# Patient Record
Sex: Female | Born: 1945 | Race: White | Hispanic: No | Smoking: Former smoker
Health system: Southern US, Community
[De-identification: ages and names within clinical notes are randomized; demographics above are authoritative.]

## PROBLEM LIST (undated history)

## (undated) DIAGNOSIS — I10 Essential (primary) hypertension: Secondary | ICD-10-CM

## (undated) DIAGNOSIS — E7800 Pure hypercholesterolemia, unspecified: Secondary | ICD-10-CM

## (undated) DIAGNOSIS — I700 Atherosclerosis of aorta: Secondary | ICD-10-CM

## (undated) DIAGNOSIS — R7303 Prediabetes: Secondary | ICD-10-CM

## (undated) DIAGNOSIS — M1990 Unspecified osteoarthritis, unspecified site: Secondary | ICD-10-CM

## (undated) DIAGNOSIS — E039 Hypothyroidism, unspecified: Secondary | ICD-10-CM

## (undated) HISTORY — PX: COLONOSCOPY: SHX174

## (undated) HISTORY — PX: APPENDECTOMY: SHX54

## (undated) HISTORY — PX: TONSILLECTOMY: SUR1361

---

## 2017-07-08 DEATH — deceased

## 2018-11-28 ENCOUNTER — Other Ambulatory Visit: Payer: Self-pay | Admitting: Orthopedic Surgery

## 2018-11-28 DIAGNOSIS — M5442 Lumbago with sciatica, left side: Principal | ICD-10-CM

## 2018-11-28 DIAGNOSIS — M5441 Lumbago with sciatica, right side: Principal | ICD-10-CM

## 2018-11-28 DIAGNOSIS — G8929 Other chronic pain: Secondary | ICD-10-CM

## 2018-11-28 DIAGNOSIS — M9933 Osseous stenosis of neural canal of lumbar region: Secondary | ICD-10-CM

## 2018-12-06 ENCOUNTER — Ambulatory Visit
Admission: RE | Admit: 2018-12-06 | Discharge: 2018-12-06 | Disposition: A | Discharge status: Home / Self Care | Payer: Medicare Other | Source: Ambulatory Visit | Attending: Orthopedic Surgery | Admitting: Orthopedic Surgery

## 2018-12-06 DIAGNOSIS — M5442 Lumbago with sciatica, left side: Secondary | ICD-10-CM | POA: Diagnosis present

## 2018-12-06 DIAGNOSIS — G8929 Other chronic pain: Secondary | ICD-10-CM | POA: Diagnosis present

## 2018-12-06 DIAGNOSIS — M5441 Lumbago with sciatica, right side: Secondary | ICD-10-CM | POA: Diagnosis present

## 2018-12-06 DIAGNOSIS — M9933 Osseous stenosis of neural canal of lumbar region: Secondary | ICD-10-CM | POA: Diagnosis present

## 2018-12-23 ENCOUNTER — Ambulatory Visit
Admission: RE | Admit: 2018-12-23 | Discharge: 2018-12-23 | Disposition: A | Discharge status: Home / Self Care | Payer: Medicare Other | Source: Ambulatory Visit | Attending: Neurosurgery | Admitting: Neurosurgery

## 2018-12-23 ENCOUNTER — Other Ambulatory Visit: Payer: Self-pay

## 2018-12-23 ENCOUNTER — Encounter
Admission: RE | Admit: 2018-12-23 | Discharge: 2018-12-23 | Disposition: A | Discharge status: Home / Self Care | Payer: Medicare Other | Source: Ambulatory Visit | Attending: Neurosurgery | Admitting: Neurosurgery

## 2018-12-23 DIAGNOSIS — I10 Essential (primary) hypertension: Secondary | ICD-10-CM

## 2018-12-23 DIAGNOSIS — R9431 Abnormal electrocardiogram [ECG] [EKG]: Secondary | ICD-10-CM | POA: Diagnosis not present

## 2018-12-23 HISTORY — DX: Hypothyroidism, unspecified: E03.9

## 2018-12-23 HISTORY — DX: Unspecified osteoarthritis, unspecified site: M19.90

## 2018-12-23 HISTORY — DX: Essential (primary) hypertension: I10

## 2018-12-23 LAB — URINALYSIS, ROUTINE W REFLEX MICROSCOPIC
Bacteria, UA: NONE SEEN
Bilirubin Urine: NEGATIVE
Glucose, UA: NEGATIVE mg/dL
Hgb urine dipstick: NEGATIVE
Ketones, ur: NEGATIVE mg/dL
Nitrite: NEGATIVE
Protein, ur: NEGATIVE mg/dL
Specific Gravity, Urine: 1.011 (ref 1.005–1.030)
pH: 6 (ref 5.0–8.0)

## 2018-12-23 LAB — BASIC METABOLIC PANEL
Anion gap: 11 (ref 5–15)
BUN: 22 mg/dL (ref 8–23)
CO2: 22 mmol/L (ref 22–32)
Calcium: 9.5 mg/dL (ref 8.9–10.3)
Chloride: 105 mmol/L (ref 98–111)
Creatinine, Ser: 0.53 mg/dL (ref 0.44–1.00)
GFR calc Af Amer: >60 mL/min (ref >60)
GLUCOSE: 93 mg/dL (ref 70–99)
Potassium: 3.9 mmol/L (ref 3.5–5.1)
Sodium: 138 mmol/L (ref 135–145)

## 2018-12-23 LAB — CBC
HCT: 36.4 % (ref 36.0–46.0)
Hemoglobin: 11.4 g/dL — ABNORMAL LOW (ref 12.0–15.0)
MCH: 27.6 pg (ref 26.0–34.0)
MCHC: 31.3 g/dL (ref 30.0–36.0)
MCV: 88.1 fL (ref 80.0–100.0)
PLATELETS: 279 10*3/uL (ref 150–400)
RBC: 4.13 MIL/uL (ref 3.87–5.11)
RDW: 14.3 % (ref 11.5–15.5)
WBC: 9.1 10*3/uL (ref 4.0–10.5)
nRBC: 0 % (ref 0.0–0.2)

## 2018-12-23 LAB — PROTIME-INR
INR: 1 (ref 0.8–1.2)
Prothrombin Time: 12.7 seconds (ref 11.4–15.2)

## 2018-12-23 LAB — APTT: aPTT: 33 seconds (ref 24–36)

## 2018-12-23 NOTE — Patient Instructions (Signed)
Your procedure is scheduled on: 12/29/18 Report to Radiology. [facility] To find out your arrival time please call [phone] between 1PM - 3PM on 12/26/18.  Remember: Instructions that are not followed completely may result in serious medical risk,  up to and including death, or upon the discretion of your surgeon and anesthesiologist your  surgery may need to be rescheduled.     _X__ 1. Do not eat food after midnight the night before your procedure.                 No gum chewing or hard candies. You may drink clear liquids up to 2 hours                 before you are scheduled to arrive for your surgery- DO not drink clear                 liquids within 2 hours of the start of your surgery.                 Clear Liquids include:  water, apple juice without pulp, clear carbohydrate                 drink such as Clearfast of Gatorade, Black Coffee or Tea (Do not add                 anything to coffee or tea).  __X__2.  On the morning of surgery brush your teeth with toothpaste and water, you                may rinse your mouth with mouthwash if you wish.  Do not swallow any toothpaste of mouthwash.     _X__ 3.  No Alcohol for 24 hours before or after surgery.   _X__ 4.  Do Not Smoke or use e-cigarettes For 24 Hours Prior to Your Surgery.                 Do not use any chewable tobacco products for at least 6 hours prior to                 surgery.  ____  5.  Bring all medications with you on the day of surgery if instructed.   _X  6.  Notify your doctor if there is any change in your medical condition      (cold, fever, infections).     Do not wear jewelry, make-up, hairpins, clips or nail polish. Do not wear lotions, powders, or perfumes. You may wear deodorant. Do not shave 48 hours prior to surgery. Men may shave face and neck. Do not bring valuables to the hospital.    [facility] is not responsible for any belongings or  valuables.  Contacts, dentures or bridgework may not be worn into surgery. Leave your suitcase in the car. After surgery it may be brought to your room. For patients admitted to the hospital, discharge time is determined by your treatment team.   Patients discharged the day of surgery will not be allowed to drive home.   Please read over the following fact sheets that you were given:   Surgical Site Infection Prevention          ____ Take these medicines the morning of surgery with A SIP OF WATER:    1. GABAPENTIN  2. LEVOTHYROXINE  3.   4.  5.  6.  ____ Fleet Enema (as directed)   _X___  Use CHG Soap as directed  ____ Use inhalers on the day of surgery  ____ Stop metformin 2 days prior to surgery    ____ Take 1/2 of usual insulin dose the night before surgery. No insulin the morning          of surgery.   ____ Stop Coumadin/Plavix/aspirin on  __X__ Stop Anti-inflammatories on  12/23/18. OK TO TAKE TYLENOL   _X___ Stop supplements until after surgery.  NO FISH OIL UNTIL AFTER SURGERY  ____ Bring C-Pap to the hospital.

## 2018-12-24 NOTE — Pre-Procedure Instructions (Signed)
ua faxed to dr Adriana Simas

## 2019-03-05 ENCOUNTER — Encounter
Admission: RE | Admit: 2019-03-05 | Discharge: 2019-03-05 | Disposition: A | Discharge status: Home / Self Care | Payer: Medicare Other | Source: Ambulatory Visit | Attending: Neurosurgery | Admitting: Neurosurgery

## 2019-03-05 ENCOUNTER — Other Ambulatory Visit: Payer: Self-pay

## 2019-03-05 DIAGNOSIS — Z01812 Encounter for preprocedural laboratory examination: Secondary | ICD-10-CM | POA: Insufficient documentation

## 2019-03-05 DIAGNOSIS — Z1159 Encounter for screening for other viral diseases: Secondary | ICD-10-CM | POA: Diagnosis not present

## 2019-03-05 LAB — TYPE AND SCREEN
ABO/RH(D): O POS
Antibody Screen: NEGATIVE

## 2019-03-05 NOTE — Patient Instructions (Signed)
Your procedure is scheduled on:Mon 03/09/19  Report to Day Surgery.  [facility] To find out your arrival time please call [phone] between 1PM - 3PM on Friday 5/29  Remember: Instructions that are not followed completely may result in serious medical risk,  up to and including death, or upon the discretion of your surgeon and anesthesiologist your  surgery may need to be rescheduled.     _X__ 1. Do not eat food after midnight the night before your procedure.                 No gum chewing or hard candies. You may drink clear liquids up to 2 hours                 before you are scheduled to arrive for your surgery- DO not drink clear                 liquids within 2 hours of the start of your surgery.                 Clear Liquids include:  water, apple juice without pulp, clear carbohydrate                 drink such as Clearfast of Gatorade, Black Coffee or Tea (Do not add                 anything to coffee or tea).  __X__2.  On the morning of surgery brush your teeth with toothpaste and water, you                may rinse your mouth with mouthwash if you wish.  Do not swallow any toothpaste of mouthwash.     _X__ 3.  No Alcohol for 24 hours before or after surgery.   ___ 4.  Do Not Smoke or use e-cigarettes For 24 Hours Prior to Your Surgery.                 Do not use any chewable tobacco products for at least 6 hours prior to                 surgery.  ____  5.  Bring all medications with you on the day of surgery if instructed.   ___x_  6.  Notify your doctor if there is any change in your medical condition      (cold, fever, infections).     Do not wear jewelry, make-up, hairpins, clips or nail polish. Do not wear lotions, powders, or perfumes. You may wear deodorant. Do not shave 48 hours prior to surgery. Men may shave face and neck. Do not bring valuables to the hospital.    [facility] is not responsible for any belongings or  valuables.  Contacts, dentures or bridgework may not be worn into surgery. Leave your suitcase in the car. After surgery it may be brought to your room. For patients admitted to the hospital, discharge time is determined by your treatment team.   Patients discharged the day of surgery will not be allowed to drive home.   Please read over the following fact sheets that you were given:    __x__ Take these medicines the morning of surgery with A SIP OF WATER:    1. gabapentin (NEURONTIN) 100 MG capsule  2. levothyroxine (SYNTHROID, LEVOTHROID) 75 MCG tablet  3. methocarbamol (ROBAXIN) 500 MG tablet if needed  4.traMADol (ULTRAM) 50 MG tablet if need  5.  6.  ____ Fleet Enema (as directed)   ____ Use CHG Soap as directed  ____ Use inhalers on the day of surgery  ____ Stop metformin 2 days prior to surgery    ____ Take 1/2 of usual insulin dose the night before surgery. No insulin the morning          of surgery.   ____ Stop Coumadin/Plavix/aspirin on   _x___ Stop Anti-inflammatories today diclofenac (VOLTAREN) 75 MG EC tablet  No ibuprofen of Aleve   May take Tylenol   __x__ Stop supplements until after surgery.Omega-3 Fatty Acids (FISH OIL) 1200 MG CAPS    ____ Bring C-Pap to the hospital.

## 2019-03-06 LAB — NOVEL CORONAVIRUS, NAA (HOSP ORDER, SEND-OUT TO REF LAB; TAT 18-24 HRS): SARS-CoV-2, NAA: NOT DETECTED

## 2019-03-09 ENCOUNTER — Observation Stay
Admission: RE | Admit: 2019-03-09 | Discharge: 2019-03-10 | Disposition: A | Discharge status: Home / Self Care | Payer: Medicare Other | Attending: Neurosurgery | Admitting: Neurosurgery

## 2019-03-09 ENCOUNTER — Ambulatory Visit: Payer: Medicare Other

## 2019-03-09 ENCOUNTER — Other Ambulatory Visit: Payer: Self-pay

## 2019-03-09 ENCOUNTER — Encounter
Admission: RE | Disposition: A | Discharge status: Home / Self Care | Payer: Self-pay | Source: Home / Self Care | Attending: Neurosurgery

## 2019-03-09 ENCOUNTER — Ambulatory Visit: Payer: Medicare Other | Admitting: Anesthesiology

## 2019-03-09 ENCOUNTER — Encounter: Payer: Self-pay | Admitting: Anesthesiology

## 2019-03-09 DIAGNOSIS — Z87891 Personal history of nicotine dependence: Secondary | ICD-10-CM | POA: Diagnosis not present

## 2019-03-09 DIAGNOSIS — I10 Essential (primary) hypertension: Secondary | ICD-10-CM | POA: Insufficient documentation

## 2019-03-09 DIAGNOSIS — M5416 Radiculopathy, lumbar region: Secondary | ICD-10-CM | POA: Diagnosis not present

## 2019-03-09 DIAGNOSIS — M1990 Unspecified osteoarthritis, unspecified site: Secondary | ICD-10-CM | POA: Diagnosis not present

## 2019-03-09 DIAGNOSIS — E039 Hypothyroidism, unspecified: Secondary | ICD-10-CM | POA: Insufficient documentation

## 2019-03-09 DIAGNOSIS — E785 Hyperlipidemia, unspecified: Secondary | ICD-10-CM | POA: Insufficient documentation

## 2019-03-09 DIAGNOSIS — K219 Gastro-esophageal reflux disease without esophagitis: Secondary | ICD-10-CM | POA: Diagnosis not present

## 2019-03-09 DIAGNOSIS — Z419 Encounter for procedure for purposes other than remedying health state, unspecified: Secondary | ICD-10-CM

## 2019-03-09 DIAGNOSIS — M48062 Spinal stenosis, lumbar region with neurogenic claudication: Secondary | ICD-10-CM | POA: Diagnosis present

## 2019-03-09 DIAGNOSIS — Z9889 Other specified postprocedural states: Secondary | ICD-10-CM

## 2019-03-09 HISTORY — PX: LUMBAR LAMINECTOMY/DECOMPRESSION MICRODISCECTOMY: SHX5026

## 2019-03-09 LAB — ABO/RH: ABO/RH(D): O POS

## 2019-03-09 SURGERY — LUMBAR LAMINECTOMY/DECOMPRESSION MICRODISCECTOMY 1 LEVEL
Anesthesia: General

## 2019-03-09 MED ORDER — ONDANSETRON HCL 4 MG/2ML IJ SOLN
INTRAMUSCULAR | Status: AC
  Filled 2019-03-09: qty 2

## 2019-03-09 MED ORDER — GABAPENTIN 100 MG PO CAPS
100.0000 mg | ORAL_CAPSULE | Freq: Three times a day (TID) | ORAL | Status: DC
  Administered 2019-03-09 – 2019-03-10 (×2): 100 mg via ORAL
  Filled 2019-03-09 (×2): qty 1

## 2019-03-09 MED ORDER — SODIUM CHLORIDE 0.9 % IV SOLN
INTRAVENOUS | Status: DC
  Administered 2019-03-09 – 2019-03-10 (×2): via INTRAVENOUS

## 2019-03-09 MED ORDER — SODIUM CHLORIDE 0.9% FLUSH
3.0000 mL | Freq: Two times a day (BID) | INTRAVENOUS | Status: DC
  Administered 2019-03-09 – 2019-03-10 (×2): 3 mL via INTRAVENOUS

## 2019-03-09 MED ORDER — LEVOTHYROXINE SODIUM 50 MCG PO TABS
75.0000 ug | ORAL_TABLET | Freq: Every day | ORAL | Status: DC
  Administered 2019-03-10: 75 ug via ORAL
  Filled 2019-03-09: qty 1

## 2019-03-09 MED ORDER — SODIUM CHLORIDE 0.9 % IV SOLN: 250.0000 mL | INTRAVENOUS | Status: DC

## 2019-03-09 MED ORDER — LIDOCAINE HCL (PF) 2 % IJ SOLN
INTRAMUSCULAR | Status: AC
  Filled 2019-03-09: qty 10

## 2019-03-09 MED ORDER — ACETAMINOPHEN 10 MG/ML IV SOLN
INTRAVENOUS | Status: AC
  Filled 2019-03-09: qty 100

## 2019-03-09 MED ORDER — FAMOTIDINE 20 MG PO TABS
ORAL_TABLET | ORAL | Status: AC
  Administered 2019-03-09: 20 mg via ORAL
  Filled 2019-03-09: qty 1

## 2019-03-09 MED ORDER — FENTANYL CITRATE (PF) 100 MCG/2ML IJ SOLN
INTRAMUSCULAR | Status: DC | PRN
  Administered 2019-03-09 (×2): 50 ug via INTRAVENOUS

## 2019-03-09 MED ORDER — ROCURONIUM BROMIDE 100 MG/10ML IV SOLN
INTRAVENOUS | Status: DC | PRN
  Administered 2019-03-09: 40 mg via INTRAVENOUS
  Administered 2019-03-09: 10 mg via INTRAVENOUS

## 2019-03-09 MED ORDER — METHOCARBAMOL 500 MG PO TABS
500.0000 mg | ORAL_TABLET | Freq: Four times a day (QID) | ORAL | Status: DC
  Administered 2019-03-09 – 2019-03-10 (×4): 500 mg via ORAL
  Filled 2019-03-09 (×4): qty 1

## 2019-03-09 MED ORDER — FENTANYL CITRATE (PF) 100 MCG/2ML IJ SOLN
25.0000 ug | INTRAMUSCULAR | Status: DC | PRN
  Administered 2019-03-09 (×2): 25 ug via INTRAVENOUS
  Administered 2019-03-09 (×2): 50 ug via INTRAVENOUS

## 2019-03-09 MED ORDER — ACETAMINOPHEN 10 MG/ML IV SOLN
INTRAVENOUS | Status: DC | PRN
  Administered 2019-03-09: 1000 mg via INTRAVENOUS

## 2019-03-09 MED ORDER — CEFAZOLIN SODIUM-DEXTROSE 2-4 GM/100ML-% IV SOLN
INTRAVENOUS | Status: AC
  Filled 2019-03-09: qty 100

## 2019-03-09 MED ORDER — ACETAMINOPHEN 325 MG PO TABS: 650.0000 mg | ORAL_TABLET | ORAL | Status: DC | PRN

## 2019-03-09 MED ORDER — PROPOFOL 10 MG/ML IV BOLUS
INTRAVENOUS | Status: AC
  Filled 2019-03-09: qty 40

## 2019-03-09 MED ORDER — BISACODYL 5 MG PO TBEC
5.0000 mg | DELAYED_RELEASE_TABLET | Freq: Every day | ORAL | Status: DC | PRN
  Administered 2019-03-10: 5 mg via ORAL
  Filled 2019-03-09: qty 1

## 2019-03-09 MED ORDER — LACTATED RINGERS IV SOLN
INTRAVENOUS | Status: DC
  Administered 2019-03-09 (×2): via INTRAVENOUS

## 2019-03-09 MED ORDER — ROCURONIUM BROMIDE 50 MG/5ML IV SOLN
INTRAVENOUS | Status: AC
  Filled 2019-03-09: qty 1

## 2019-03-09 MED ORDER — FENTANYL CITRATE (PF) 100 MCG/2ML IJ SOLN
INTRAMUSCULAR | Status: AC
  Administered 2019-03-09: 50 ug via INTRAVENOUS
  Filled 2019-03-09: qty 2

## 2019-03-09 MED ORDER — DEXAMETHASONE SODIUM PHOSPHATE 10 MG/ML IJ SOLN
INTRAMUSCULAR | Status: AC
  Filled 2019-03-09: qty 1

## 2019-03-09 MED ORDER — ONDANSETRON HCL 4 MG/2ML IJ SOLN
INTRAMUSCULAR | Status: DC | PRN
  Administered 2019-03-09: 4 mg via INTRAVENOUS

## 2019-03-09 MED ORDER — ACETAMINOPHEN 650 MG RE SUPP: 650.0000 mg | RECTAL | Status: DC | PRN

## 2019-03-09 MED ORDER — PHENYLEPHRINE HCL (PRESSORS) 10 MG/ML IV SOLN
INTRAVENOUS | Status: AC
  Filled 2019-03-09: qty 1

## 2019-03-09 MED ORDER — SODIUM CHLORIDE 0.9% FLUSH: 3.0000 mL | INTRAVENOUS | Status: DC | PRN

## 2019-03-09 MED ORDER — THROMBIN 5000 UNITS EX SOLR
CUTANEOUS | Status: DC | PRN
  Administered 2019-03-09: 5000 [IU] via TOPICAL

## 2019-03-09 MED ORDER — NEOSTIGMINE METHYLSULFATE 10 MG/10ML IV SOLN
INTRAVENOUS | Status: DC | PRN
  Administered 2019-03-09: 3.5 mg via INTRAVENOUS

## 2019-03-09 MED ORDER — OXYCODONE HCL 5 MG PO TABS: 5.0000 mg | ORAL_TABLET | ORAL | Status: DC | PRN

## 2019-03-09 MED ORDER — PROPOFOL 10 MG/ML IV BOLUS
INTRAVENOUS | Status: DC | PRN
  Administered 2019-03-09: 150 mg via INTRAVENOUS

## 2019-03-09 MED ORDER — HYDROMORPHONE HCL 1 MG/ML IJ SOLN
INTRAMUSCULAR | Status: AC
  Administered 2019-03-09: 1 mg
  Filled 2019-03-09: qty 1

## 2019-03-09 MED ORDER — BUPIVACAINE HCL 0.5 % IJ SOLN
INTRAMUSCULAR | Status: DC | PRN
  Administered 2019-03-09: 10 mL

## 2019-03-09 MED ORDER — ONDANSETRON HCL 4 MG/2ML IJ SOLN: 4.0000 mg | Freq: Four times a day (QID) | INTRAMUSCULAR | Status: DC | PRN

## 2019-03-09 MED ORDER — ONDANSETRON HCL 4 MG PO TABS: 4.0000 mg | ORAL_TABLET | Freq: Four times a day (QID) | ORAL | Status: DC | PRN

## 2019-03-09 MED ORDER — HYDROMORPHONE HCL 1 MG/ML IJ SOLN
0.5000 mg | INTRAMUSCULAR | Status: DC | PRN
  Administered 2019-03-09: 0.5 mg via INTRAVENOUS

## 2019-03-09 MED ORDER — MAGNESIUM CITRATE PO SOLN
1.0000 | Freq: Once | ORAL | Status: DC | PRN
  Filled 2019-03-09: qty 296

## 2019-03-09 MED ORDER — OXYCODONE HCL 5 MG PO TABS
10.0000 mg | ORAL_TABLET | ORAL | Status: DC | PRN
  Administered 2019-03-09 – 2019-03-10 (×5): 10 mg via ORAL
  Filled 2019-03-09 (×5): qty 2

## 2019-03-09 MED ORDER — LIDOCAINE HCL (CARDIAC) PF 100 MG/5ML IV SOSY
PREFILLED_SYRINGE | INTRAVENOUS | Status: DC | PRN
  Administered 2019-03-09: 100 mg via INTRAVENOUS

## 2019-03-09 MED ORDER — NEOSTIGMINE METHYLSULFATE 10 MG/10ML IV SOLN
INTRAVENOUS | Status: AC
  Filled 2019-03-09: qty 1

## 2019-03-09 MED ORDER — PHENYLEPHRINE HCL (PRESSORS) 10 MG/ML IV SOLN
INTRAVENOUS | Status: DC | PRN
  Administered 2019-03-09: 50 ug via INTRAVENOUS

## 2019-03-09 MED ORDER — OXYCODONE HCL 5 MG/5ML PO SOLN: 5.0000 mg | Freq: Once | ORAL | Status: DC | PRN

## 2019-03-09 MED ORDER — ACETAMINOPHEN 500 MG PO TABS
1000.0000 mg | ORAL_TABLET | Freq: Four times a day (QID) | ORAL | Status: AC
  Administered 2019-03-09 – 2019-03-10 (×3): 1000 mg via ORAL
  Filled 2019-03-09 (×3): qty 2

## 2019-03-09 MED ORDER — METHOCARBAMOL 1000 MG/10ML IJ SOLN
500.0000 mg | Freq: Four times a day (QID) | INTRAVENOUS | Status: DC
  Filled 2019-03-09: qty 5

## 2019-03-09 MED ORDER — FAMOTIDINE 20 MG PO TABS
20.0000 mg | ORAL_TABLET | Freq: Once | ORAL | Status: AC
  Administered 2019-03-09: 09:00:00 20 mg via ORAL

## 2019-03-09 MED ORDER — LIDOCAINE-EPINEPHRINE (PF) 1 %-1:200000 IJ SOLN
INTRAMUSCULAR | Status: DC | PRN
  Administered 2019-03-09: 10 mL

## 2019-03-09 MED ORDER — GLYCOPYRROLATE 0.2 MG/ML IJ SOLN
INTRAMUSCULAR | Status: AC
  Filled 2019-03-09: qty 3

## 2019-03-09 MED ORDER — CEFAZOLIN SODIUM-DEXTROSE 2-4 GM/100ML-% IV SOLN
2.0000 g | Freq: Once | INTRAVENOUS | Status: AC
  Administered 2019-03-09: 10:00:00 2 g via INTRAVENOUS

## 2019-03-09 MED ORDER — SODIUM CHLORIDE 0.9 % IR SOLN
Status: DC | PRN
  Administered 2019-03-09: 1000 mL

## 2019-03-09 MED ORDER — GLYCOPYRROLATE 0.2 MG/ML IJ SOLN
INTRAMUSCULAR | Status: DC | PRN
  Administered 2019-03-09: .6 mg via INTRAVENOUS

## 2019-03-09 MED ORDER — DEXAMETHASONE SODIUM PHOSPHATE 10 MG/ML IJ SOLN
INTRAMUSCULAR | Status: DC | PRN
  Administered 2019-03-09: 5 mg via INTRAVENOUS

## 2019-03-09 MED ORDER — PHENOL 1.4 % MT LIQD
1.0000 | OROMUCOSAL | Status: DC | PRN
  Filled 2019-03-09: qty 177

## 2019-03-09 MED ORDER — FENTANYL CITRATE (PF) 100 MCG/2ML IJ SOLN
INTRAMUSCULAR | Status: AC
  Filled 2019-03-09: qty 2

## 2019-03-09 MED ORDER — MENTHOL 3 MG MT LOZG
1.0000 | LOZENGE | OROMUCOSAL | Status: DC | PRN
  Filled 2019-03-09: qty 9

## 2019-03-09 MED ORDER — SENNA 8.6 MG PO TABS
1.0000 | ORAL_TABLET | Freq: Two times a day (BID) | ORAL | Status: DC
  Administered 2019-03-09 – 2019-03-10 (×2): 8.6 mg via ORAL
  Filled 2019-03-09 (×3): qty 1

## 2019-03-09 MED ORDER — POLYETHYLENE GLYCOL 3350 17 G PO PACK
17.0000 g | PACK | Freq: Every day | ORAL | Status: DC | PRN
  Filled 2019-03-09: qty 1

## 2019-03-09 MED ORDER — OXYCODONE HCL 5 MG PO TABS: 5.0000 mg | ORAL_TABLET | Freq: Once | ORAL | Status: DC | PRN

## 2019-03-09 SURGICAL SUPPLY — 53 items
BUR NEURO DRILL SOFT 3.0X3.8M (BURR) ×2 IMPLANT
CANISTER SUCT 1200ML W/VALVE (MISCELLANEOUS) ×4 IMPLANT
CHLORAPREP W/TINT 26 (MISCELLANEOUS) ×4 IMPLANT
COUNTER NEEDLE 20/40 LG (NEEDLE) ×2 IMPLANT
COVER LIGHT HANDLE STERIS (MISCELLANEOUS) ×4 IMPLANT
COVER WAND RF STERILE (DRAPES) ×2 IMPLANT
CUP MEDICINE 2OZ PLAST GRAD ST (MISCELLANEOUS) ×2 IMPLANT
DERMABOND ADVANCED (GAUZE/BANDAGES/DRESSINGS) ×1
DERMABOND ADVANCED .7 DNX12 (GAUZE/BANDAGES/DRESSINGS) ×1 IMPLANT
DRAPE C-ARM 42X72 X-RAY (DRAPES) ×4 IMPLANT
DRAPE LAPAROTOMY 100X77 ABD (DRAPES) ×2 IMPLANT
DRAPE MICROSCOPE SPINE 48X150 (DRAPES) IMPLANT
DRAPE SURG 17X11 SM STRL (DRAPES) ×2 IMPLANT
DURASEAL APPLICATOR TIP (TIP) IMPLANT
DURASEAL SPINE SEALANT 3ML (MISCELLANEOUS) IMPLANT
ELECT CAUTERY BLADE TIP 2.5 (TIP) ×2
ELECT EZSTD 165MM 6.5IN (MISCELLANEOUS) ×2
ELECT REM PT RETURN 9FT ADLT (ELECTROSURGICAL) ×2
ELECTRODE CAUTERY BLDE TIP 2.5 (TIP) ×1 IMPLANT
ELECTRODE EZSTD 165MM 6.5IN (MISCELLANEOUS) ×1 IMPLANT
ELECTRODE REM PT RTRN 9FT ADLT (ELECTROSURGICAL) ×1 IMPLANT
GAUZE SPONGE 4X4 12PLY STRL (GAUZE/BANDAGES/DRESSINGS) ×2 IMPLANT
GLOVE BIOGEL PI IND STRL 7.0 (GLOVE) ×1 IMPLANT
GLOVE BIOGEL PI INDICATOR 7.0 (GLOVE) ×1
GLOVE INDICATOR 8.0 STRL GRN (GLOVE) ×6 IMPLANT
GLOVE SURG SYN 7.0 (GLOVE) ×4 IMPLANT
GLOVE SURG SYN 8.0 (GLOVE) ×2 IMPLANT
GOWN STRL REUS W/ TWL XL LVL3 (GOWN DISPOSABLE) ×1 IMPLANT
GOWN STRL REUS W/TWL MED LVL3 (GOWN DISPOSABLE) ×2 IMPLANT
GOWN STRL REUS W/TWL XL LVL3 (GOWN DISPOSABLE) ×1
GRADUATE 1200CC STRL 31836 (MISCELLANEOUS) ×2 IMPLANT
KIT TURNOVER KIT A (KITS) ×2 IMPLANT
KIT WILSON FRAME (KITS) ×2 IMPLANT
KNIFE BAYONET SHORT DISCETOMY (MISCELLANEOUS) IMPLANT
MARKER SKIN DUAL TIP RULER LAB (MISCELLANEOUS) ×4 IMPLANT
NDL SAFETY ECLIPSE 18X1.5 (NEEDLE) ×1 IMPLANT
NEEDLE HYPO 18GX1.5 SHARP (NEEDLE) ×1
NEEDLE HYPO 22GX1.5 SAFETY (NEEDLE) ×2 IMPLANT
NS IRRIG 1000ML POUR BTL (IV SOLUTION) ×2 IMPLANT
PACK LAMINECTOMY NEURO (CUSTOM PROCEDURE TRAY) ×2 IMPLANT
PAD ARMBOARD 7.5X6 YLW CONV (MISCELLANEOUS) IMPLANT
SPOGE SURGIFLO 8M (HEMOSTASIS) ×1
SPONGE SURGIFLO 8M (HEMOSTASIS) ×1 IMPLANT
STAPLER SKIN PROX 35W (STAPLE) IMPLANT
SUT NURALON 4 0 TR CR/8 (SUTURE) IMPLANT
SUT POLYSORB 2-0 5X18 GS-10 (SUTURE) ×2 IMPLANT
SUT VIC AB 0 CT1 18XCR BRD 8 (SUTURE) ×2 IMPLANT
SUT VIC AB 0 CT1 8-18 (SUTURE) ×2
SYR 10ML LL (SYRINGE) ×4 IMPLANT
SYR 30ML LL (SYRINGE) ×2 IMPLANT
SYR 3ML LL SCALE MARK (SYRINGE) ×2 IMPLANT
TOWEL OR 17X26 4PK STRL BLUE (TOWEL DISPOSABLE) ×8 IMPLANT
TUBING CONNECTING 10 (TUBING) ×2 IMPLANT

## 2019-03-09 NOTE — Anesthesia Post-op Follow-up Note (Signed)
Anesthesia QCDR form completed.        

## 2019-03-09 NOTE — H&P (Signed)
History & Physical   Date of Service: 03/09/2019     Time:  9:23 AM  Chief Complaint: Leg pain  History of Present Illness: Ms. Glazener is here with ongoing symptoms of back hip and leg pain. She states the leg symptoms are most bothersome to her. She does not feel that the back pain is the most severe but it is pretty constant throughout the day. She does not notice any change in position with the back pain. The back pain will bother her at night causing her to have to change positions while sleeping. The leg symptoms consist of pain in the hips that will go down into the legs to the knees. Occasionally she will feel some pain go down towards her ankles. She does feel like there is heaviness in her legs and some numbness in the thighs. This does seem to appear to worsen with walking and can get better with sitting but the back pain and hip pain are pretty constant. She did have an injection in the left hip which did relieve some of the pain there for over a month. She is done exercises but has not attempted any injections in the back. She has been on medication recently including NSAIDs and tramadol. She has noticed some improvement with the NSAIDs. She had an MRI of her lumbar spine which showed stenosis at L4/5 and we discussed laminectomy and decompression given failure of conservative measures   Past Medical History:  Diagnosis Date  . GERD (gastroesophageal reflux disease)   . Hyperlipidemia   . Hypertension    Past Surgical History:  Procedure Laterality Date  . cyst removal from spine     "about 50 years ago"  . Hardware removal, excision of osteophyte and debridement of gouty tophus, right index dip joint  Right 06/27/2016   Dr.Poggi   . KNEE ARTHROSCOPY Right 01/2012  . Pin put in right hand index finger     Family History  Problem Relation Age of Onset  . No Known Problems Mother   . No Known Problems Father    Social History   Social History  . Marital status: Single     Spouse name: N/A  . Number of children: N/A  . Years of education: N/A   Social History Main Topics  . Smoking status: Former Smoker    Quit date: 01/06/1955  . Smokeless tobacco: Never Used  . Alcohol use 0.0 oz/week  . Drug use: No  . Sexual activity: Not Asked   Other Topics Concern  . None   Social History Narrative  . None     No Known Allergies  Medications: Cannot display prior to admission medications because the patient has not been admitted in this contact.    Review of Systems:  General ROS: Negative Psychological ROS: Negative Ophthalmic ROS: Negative ENT ROS: Negative Hematological and Lymphatic ROS: Negative  Endocrine ROS: Negative Respiratory ROS: Negative Cardiovascular ROS: Negative Gastrointestinal ROS: Negative Genito-Urinary ROS: Negative Musculoskeletal ROS: Positive for back pain, bilateral hip pain Neurological ROS: Positive for leg heaviness, numbness Dermatological ROS: Negative   Physical Exam: Temp:  [97.8 F (36.6 C)] 97.8 F (36.6 C) (06/01 0824) Pulse Rate:  [78] 78 (06/01 0824) Resp:  [16] 16 (06/01 0824) BP: (155)/(73) 155/73 (06/01 0824) SpO2:  [100 %] 100 % (06/01 0824) Weight:  [76 kg] 76 kg (06/01 0824) Temp (24hrs), Avg:97.8 F (36.6 C), Min:97.8 F (36.6 C), Max:97.8 F (36.6 C)  Weight: 62.2 kg (137 lb 3.2  oz)  General appearance: Alert, cooperative, in no acute distress Head: Normocephalic, atraumatic Eyes: Normal, EOM intact Oropharynx: Moist without lesions CV: Regular rate Pulm: Clear to auscultation Back: Tenderness to palpation of the midline or paramedian region Ext: No edema noted in lower extremities  Neurologic exam:  Mental status: alertness: alert, affect: normal Speech: fluent and clear Motor:strength symmetric 5/5 in bilateral lower extremities including hip flexion, knee flexion, knee extension with dorsiflexion, plantarflexion Sensory: intact to light touch in bilateral lower  extremities Reflexes: 2+ and symmetric bilaterally for patella Gait: normal    Data: MRI lumbar spine: There is a normal lordotic curvature. There is evidence of degenerative disease noted most notably from L3-S1. There is a normal alignment. There is mild disc bulge at L3-4 and L5-S1 results in mild lateral recess stenosis. There is a broad-based disc bulge and severe ligament hypertrophy resulting in severe stenosis at L4-5 centrally.  Assessment & Plan:  Active Problems:   Lumbar Stenosis with neurogenic claudication   1. Plan for L4/5 decompression  VTE Prophylaxis: SCDs  Code Status: Full Code  Discharge Planning: 23 hours observation  Nathaniel ManSTEVEN HENRY Roxy Mastandrea, MD 09/03/2016

## 2019-03-09 NOTE — Anesthesia Postprocedure Evaluation (Signed)
Anesthesia Post Note  Patient: Felicia Henderson  Procedure(s) Performed: lumbar  laminectomy L4-5 (N/A )  Patient location during evaluation: PACU Anesthesia Type: General Level of consciousness: awake and alert Pain management: pain level controlled Vital Signs Assessment: post-procedure vital signs reviewed and stable Respiratory status: spontaneous breathing, nonlabored ventilation, respiratory function stable and patient connected to nasal cannula oxygen Cardiovascular status: blood pressure returned to baseline and stable Postop Assessment: no apparent nausea or vomiting Anesthetic complications: no     Last Vitals:  Vitals:   03/09/19 1243 03/09/19 1255  BP: (!) 105/50 119/62  Pulse: 62 63  Resp: 10 17  Temp:  36.6 C  SpO2: 96% 99%    Last Pain:  Vitals:   03/09/19 1255  TempSrc:   PainSc: 4                  Cleda Mccreedy Leverne Amrhein

## 2019-03-09 NOTE — Transfer of Care (Signed)
Immediate Anesthesia Transfer of Care Note  Patient: Felicia Henderson  Procedure(s) Performed: lumbar  laminectomy L4-5 (N/A )  Patient Location: PACU  Anesthesia Type:General  Level of Consciousness: awake, alert , oriented and patient cooperative  Airway & Oxygen Therapy: Patient Spontanous Breathing and Patient connected to face mask oxygen  Post-op Assessment: Report given to RN, Post -op Vital signs reviewed and stable and Patient moving all extremities  Post vital signs: Reviewed and stable  Last Vitals:  Vitals Value Taken Time  BP 139/66 03/09/2019 11:58 AM  Temp 36.5 C 03/09/2019 11:58 AM  Pulse 72 03/09/2019 12:03 PM  Resp 18 03/09/2019 12:03 PM  SpO2 100 % 03/09/2019 12:03 PM  Vitals shown include unvalidated device data.  Last Pain:  Vitals:   03/09/19 1158  TempSrc:   PainSc: 0-No pain         Complications: No apparent anesthesia complications

## 2019-03-09 NOTE — Op Note (Signed)
Operative Note  SURGERY DATE:03/09/2019  PRE-OP DIAGNOSIS: Lumbar Stenosis with Neurogenic Claudication(m48.06)  POST-OP DIAGNOSIS:Post-Op Diagnosis Codes: Lumbar Stenosiswith Neurogenic Claudication (m48.06)  Procedure(s) with comments: L4/5 Laminectomy, Lateral recess decompression   SURGEON:  * Nathaniel Man, MD   Ivar Drape, PA, Assistant  ANESTHESIA:General  OPERATIVE FINDINGS: Hypertrophied Ligament andLateral Recess Stenosis,Compressionat L4/5  OPERATIVE REPORT:   Indication: Ms. Felicia Henderson presented to the clinic on3/10with ongoing bilaterallegnumbnessand pain worsenedwith ambulation. She had failed conservative management including PT and prescription medications. MRI revealed stenosis at L4/5.Xrays showed no dynamic instability.Lumbardecompression was discussed to relieve symptoms.Therisks of surgery were explained to include hematoma, infection, damage to nerve roots, CSF leak, weakness, numbness, pain, need for future surgery including fusion, heart attack, and stroke. She elected to proceed with surgery for symptom relief.   Procedure The patient was brought to the OR after informed consent was obtained.She was given general anesthesia and intubated by the anesthesia service. Vascular access lines were placed.The patient was then placed prone on a Wilson frameensuring all pressure points were padded. A time-out was performed per protocol.   The patient was sterilely prepped and draped.Theplannedincision wasfound using fluoroscopy andmarked andinstilled withlocal anesthetic with epinephrine. The skin was opened sharply and the dissection taken to the fascia. This was incised and cautery was used to dissect the subperiosteal plane to expose the spinous processes and lamina of L4-5. Retractors were inserted and hemostasis was achieved. X-ray was used to confirm location.  Next, a matchstickdrill bit was used to  remove the inferior L4lamina and superior L5lamina to expose the ligament.The ligament was seen to be hypertrophied at this level.The underlying ligament was freed and removed with combination of rongeurs starting medially to lateral. The decompression was carried laterally where facet overgrowth and thick ligament was seen in the lateral recess. This was removed bilaterally.The dura was seen to expand as this was performed. The blunt tool was attempted to pass out the lateral recesses bilaterally and found to be free as well.The bone edges were smooth and no obvious compression remained.  Once the dura appeared free from all the bone edges and all soft tissue compression was removed, hemostasis was obtained with Floseal and cautery. The wound was irrigated profusely. The muscle was then closed using 0 vicryl followed by the fascia with 0 vicryl. Next, multiple subcutaneous and dermal layers were closed with 2-0 vicryl until the epidermis was well approximated. The skin was closed withDermabond.   The patient was returned to supine position and extubated by the anesthesia service. The patient was then taken to the PACU for post-operative care where he was moving extremities symmetrically.   ESTIMATED BLOOD LOSS: 30cc  SPECIMENS None  IMPLANT None   I performed the case in its entiretywith the assistance of Ivar Drape, [place],  Lucy Chris, MD [phone]

## 2019-03-09 NOTE — Anesthesia Procedure Notes (Signed)
Procedure Name: Intubation Date/Time: 03/09/2019 10:07 AM Performed by: Henrietta Hoover, CRNA Pre-anesthesia Checklist: Patient identified, Emergency Drugs available, Suction available and Patient being monitored Patient Re-evaluated:Patient Re-evaluated prior to induction Oxygen Delivery Method: Circle system utilized Preoxygenation: Pre-oxygenation with 100% oxygen Induction Type: IV induction Ventilation: Mask ventilation without difficulty Laryngoscope Size: 3 and McGraph Grade View: Grade I Tube type: Oral Tube size: 7.0 mm Number of attempts: 1 Airway Equipment and Method: Stylet and Video-laryngoscopy Placement Confirmation: ETT inserted through vocal cords under direct vision,  positive ETCO2 and breath sounds checked- equal and bilateral Secured at: 21 cm Tube secured with: Tape Dental Injury: Teeth and Oropharynx as per pre-operative assessment  Comments: Head/neck kept neutral and stable during intubation and positioning prone

## 2019-03-09 NOTE — Interval H&P Note (Signed)
History and Physical Interval Note:  03/09/2019 9:27 AM  Felicia Henderson  has presented today for surgery, with the diagnosis of LUMBAR STENOSIS WITH NERUOGENIC CLAUDICHTION.  The various methods of treatment have been discussed with the patient and family. After consideration of risks, benefits and other options for treatment, the patient has consented to  Procedure(s): LUMBAR LAMINECTOMY/DECOMPRESSION MICRODISCECTOMY 1 LEVEL, STENOSIS WITH NEUROGENIC CLAUDICHTION (N/A) as a surgical intervention.  The patient's history has been reviewed, patient examined, no change in status, stable for surgery.  I have reviewed the patient's chart and labs.  Questions were answered to the patient's satisfaction.     Felicia Henderson

## 2019-03-09 NOTE — Anesthesia Preprocedure Evaluation (Signed)
Anesthesia Evaluation  Patient identified by MRN, date of birth, ID band Patient awake    Reviewed: Allergy & Precautions, H&P , NPO status , Patient's Chart, lab work & pertinent test results  History of Anesthesia Complications Negative for: history of anesthetic complications  Airway Mallampati: III  TM Distance: <3 FB Neck ROM: limited    Dental  (+) Chipped, Poor Dentition   Pulmonary neg shortness of breath, former smoker,           Cardiovascular Exercise Tolerance: Good hypertension, (-) angina(-) Past MI and (-) DOE      Neuro/Psych negative neurological ROS  negative psych ROS   GI/Hepatic negative GI ROS, Neg liver ROS, neg GERD  ,  Endo/Other  Hypothyroidism   Renal/GU      Musculoskeletal  (+) Arthritis ,   Abdominal   Peds  Hematology negative hematology ROS (+)   Anesthesia Other Findings Past Medical History: No date: Arthritis No date: Hypertension No date: Hypothyroidism  Past Surgical History: No date: APPENDECTOMY No date: TONSILLECTOMY     Reproductive/Obstetrics negative OB ROS                             Anesthesia Physical Anesthesia Plan  ASA: III  Anesthesia Plan: General ETT   Post-op Pain Management:    Induction: Intravenous  PONV Risk Score and Plan: Ondansetron, Dexamethasone, Midazolam and Treatment may vary due to age or medical condition  Airway Management Planned: Oral ETT  Additional Equipment:   Intra-op Plan:   Post-operative Plan: Extubation in OR  Informed Consent: I have reviewed the patients History and Physical, chart, labs and discussed the procedure including the risks, benefits and alternatives for the proposed anesthesia with the patient or authorized representative who has indicated his/her understanding and acceptance.     Dental Advisory Given  Plan Discussed with: Anesthesiologist, CRNA and  Surgeon  Anesthesia Plan Comments: (Patient consented for risks of anesthesia including but not limited to:  - adverse reactions to medications - damage to teeth, lips or other oral mucosa - sore throat or hoarseness - Damage to heart, brain, lungs or loss of life  Patient voiced understanding.)        Anesthesia Quick Evaluation

## 2019-03-09 NOTE — Progress Notes (Signed)
   03/09/19 1500  Clinical Encounter Type  Visited With Patient  Visit Type Initial  Spiritual Encounters  Spiritual Needs Prayer   Chaplain visited with the patient during routine rounds. Upon arrival, she was laying in bed and was awake and alert. She was welcoming and immediately expressed thanksgiving for having had a successful operation this morning; she confirmed that she is already experiencing relief. Patient lamented about the times we are experiencing in the global pandemic and the burden of being hospitalized under these conditions when no family or friends are permitted to visit. Chaplain affirmed this grief and gave the patient space to share. The patient requested prayer and wanted to express gratitude to God for the success of her operation, her village (family and friends who have been loving and supportive), and for the trying times in our nation and around the world. Chaplain prayed for the aforementioned concerns and the patient was grateful.

## 2019-03-10 DIAGNOSIS — M48062 Spinal stenosis, lumbar region with neurogenic claudication: Secondary | ICD-10-CM | POA: Diagnosis not present

## 2019-03-10 MED ORDER — METHOCARBAMOL 500 MG PO TABS: 500.0000 mg | ORAL_TABLET | Freq: Four times a day (QID) | ORAL | 0 refills | Status: AC | PRN

## 2019-03-10 MED ORDER — OXYCODONE HCL 5 MG PO TABS: 5.0000 mg | ORAL_TABLET | ORAL | 0 refills | Status: DC | PRN

## 2019-03-10 NOTE — Discharge Summary (Signed)
Procedure: L4- 5 lumbar laminectomy Procedure date: 03/09/2019 Diagnosis: Lumbar radiculopathy  History: Vinda Wayland is s/p L4-5 lumbar laminectomy for lumbar radiculopathy POD1: Recovering well.  Symptoms that were present prior to surgery have improved.  Denies any new lower extremity pain/numbness/tingling/weakness.  She has been able to eat, void, and ambulate to bathroom without issue.  Pain currently rated 4-5/10 located at the incision site.  Physical Exam: Vitals:   03/10/19 0346 03/10/19 0822  BP: (!) 110/50 (!) 117/58  Pulse: 60 78  Resp: 16 20  Temp: 97.7 F (36.5 C) 97.6 F (36.4 C)  SpO2: 98% 98%    AA Ox3 Strength:5/5 throughout lower extremities Sensation: Intact and symmetric throughout lower extremities Skin: Glue intact at incision site.  No active bleeding, warmth, drainage.  Data:  No results for input(s): NA, K, CL, CO2, BUN, CREATININE, LABGLOM, GLUCOSE, CALCIUM in the last 168 hours. No results for input(s): AST, ALT, ALKPHOS in the last 168 hours.  Invalid input(s): TBILI   No results for input(s): WBC, HGB, HCT, PLT in the last 168 hours. No results for input(s): APTT, INR in the last 168 hours.       Other tests/results: No imaging reviewed  Assessment/Plan:  Hemen Boardley is POD1 status post L4-5 lumbar decompression for lumbar radiculopathy.  Symptoms are present prior to surgery have improved.  We will continue postop pain management with pain medication, muscle relaxer, Tylenol, ibuprofen as needed.  She is scheduled to follow-up in approximately 2 weeks in clinic to monitor progress.  Advised to contact office if any questions or concerns arise before then. Home physical therapy was arranged prior to surgery.  She has been contacted in regard to scheduling home visit.  Ivar Drape PA-C Department of Neurosurgery

## 2019-03-10 NOTE — Progress Notes (Signed)
Discharge instructions reviewed with patient. Verbalized understanding. IV removed. Vitals stable. Waiting on daughter to arrive.

## 2019-03-10 NOTE — Plan of Care (Signed)

## 2019-03-10 NOTE — Progress Notes (Signed)
Patient states she feels a little constipation. Dulcolax given per PRN order. Warm blanket and a pillow given to help with comfort of back. Pain med not due until 0402 and will be administered per request per PRN order. Will continue to monitor to end of shift.

## 2019-03-10 NOTE — Discharge Instructions (Signed)

## 2020-11-08 ENCOUNTER — Other Ambulatory Visit: Payer: Self-pay | Admitting: Internal Medicine

## 2020-11-08 ENCOUNTER — Other Ambulatory Visit: Payer: Self-pay

## 2020-11-08 ENCOUNTER — Other Ambulatory Visit
Admission: RE | Admit: 2020-11-08 | Discharge: 2020-11-08 | Disposition: A | Discharge status: Home / Self Care | Payer: Medicare Other | Source: Ambulatory Visit | Attending: Internal Medicine | Admitting: Internal Medicine

## 2020-11-08 ENCOUNTER — Ambulatory Visit
Admission: RE | Admit: 2020-11-08 | Discharge: 2020-11-08 | Disposition: A | Discharge status: Home / Self Care | Payer: Medicare Other | Source: Ambulatory Visit | Attending: Internal Medicine | Admitting: Internal Medicine

## 2020-11-08 DIAGNOSIS — I10 Essential (primary) hypertension: Secondary | ICD-10-CM | POA: Insufficient documentation

## 2020-11-08 DIAGNOSIS — R7989 Other specified abnormal findings of blood chemistry: Secondary | ICD-10-CM | POA: Diagnosis not present

## 2020-11-08 DIAGNOSIS — R0789 Other chest pain: Secondary | ICD-10-CM | POA: Insufficient documentation

## 2020-11-08 LAB — FIBRIN DERIVATIVES D-DIMER (ARMC ONLY): Fibrin derivatives D-dimer (ARMC): 1074.6 ng/mL (FEU) — ABNORMAL HIGH (ref 0.00–499.00)

## 2020-11-08 MED ORDER — IOHEXOL 350 MG/ML SOLN
75.0000 mL | Freq: Once | INTRAVENOUS | Status: AC | PRN
  Administered 2020-11-08: 75 mL via INTRAVENOUS

## 2021-09-08 ENCOUNTER — Other Ambulatory Visit: Payer: Self-pay | Admitting: Internal Medicine

## 2021-09-08 DIAGNOSIS — Z1231 Encounter for screening mammogram for malignant neoplasm of breast: Secondary | ICD-10-CM

## 2021-09-12 ENCOUNTER — Other Ambulatory Visit: Payer: Self-pay | Admitting: Physician Assistant

## 2021-09-12 DIAGNOSIS — M12812 Other specific arthropathies, not elsewhere classified, left shoulder: Secondary | ICD-10-CM

## 2021-09-14 ENCOUNTER — Other Ambulatory Visit: Payer: Self-pay | Admitting: Internal Medicine

## 2021-09-14 DIAGNOSIS — N63 Unspecified lump in unspecified breast: Secondary | ICD-10-CM

## 2021-09-18 ENCOUNTER — Other Ambulatory Visit: Payer: Self-pay | Admitting: Internal Medicine

## 2021-09-18 DIAGNOSIS — N63 Unspecified lump in unspecified breast: Secondary | ICD-10-CM

## 2021-09-18 DIAGNOSIS — R928 Other abnormal and inconclusive findings on diagnostic imaging of breast: Secondary | ICD-10-CM

## 2021-09-21 ENCOUNTER — Other Ambulatory Visit: Payer: Self-pay | Admitting: Internal Medicine

## 2021-09-21 DIAGNOSIS — N63 Unspecified lump in unspecified breast: Secondary | ICD-10-CM

## 2021-09-21 DIAGNOSIS — R928 Other abnormal and inconclusive findings on diagnostic imaging of breast: Secondary | ICD-10-CM

## 2021-09-23 ENCOUNTER — Other Ambulatory Visit: Payer: Self-pay

## 2021-09-23 ENCOUNTER — Ambulatory Visit
Admission: RE | Admit: 2021-09-23 | Discharge: 2021-09-23 | Disposition: A | Discharge status: Home / Self Care | Payer: Medicare Other | Source: Ambulatory Visit | Attending: Physician Assistant | Admitting: Physician Assistant

## 2021-09-23 DIAGNOSIS — M12812 Other specific arthropathies, not elsewhere classified, left shoulder: Secondary | ICD-10-CM | POA: Diagnosis present

## 2021-10-19 ENCOUNTER — Ambulatory Visit
Admission: RE | Admit: 2021-10-19 | Discharge: 2021-10-19 | Disposition: A | Discharge status: Home / Self Care | Payer: Medicare Other | Source: Ambulatory Visit | Attending: Internal Medicine | Admitting: Internal Medicine

## 2021-10-19 ENCOUNTER — Other Ambulatory Visit: Payer: Self-pay

## 2021-10-19 DIAGNOSIS — N63 Unspecified lump in unspecified breast: Secondary | ICD-10-CM | POA: Diagnosis not present

## 2021-10-19 DIAGNOSIS — R928 Other abnormal and inconclusive findings on diagnostic imaging of breast: Secondary | ICD-10-CM | POA: Diagnosis present

## 2021-11-16 ENCOUNTER — Other Ambulatory Visit: Payer: Self-pay | Admitting: Surgery

## 2021-11-27 ENCOUNTER — Other Ambulatory Visit: Payer: Self-pay

## 2021-11-27 ENCOUNTER — Encounter
Admission: RE | Admit: 2021-11-27 | Discharge: 2021-11-27 | Disposition: A | Discharge status: Home / Self Care | Payer: Medicare Other | Source: Ambulatory Visit | Attending: Surgery | Admitting: Surgery

## 2021-11-27 VITALS — BP 130/81 | HR 68 | Resp 16 | Ht 62.0 in | Wt 170.2 lb

## 2021-11-27 DIAGNOSIS — Z01812 Encounter for preprocedural laboratory examination: Secondary | ICD-10-CM | POA: Diagnosis not present

## 2021-11-27 DIAGNOSIS — Z01818 Encounter for other preprocedural examination: Secondary | ICD-10-CM

## 2021-11-27 HISTORY — DX: Pure hypercholesterolemia, unspecified: E78.00

## 2021-11-27 HISTORY — DX: Atherosclerosis of aorta: I70.0

## 2021-11-27 HISTORY — DX: Prediabetes: R73.03

## 2021-11-27 LAB — COMPREHENSIVE METABOLIC PANEL
ALT: 12 U/L (ref 0–44)
AST: 20 U/L (ref 15–41)
Albumin: 4.1 g/dL (ref 3.5–5.0)
Alkaline Phosphatase: 74 U/L (ref 38–126)
Anion gap: 9 (ref 5–15)
BUN: 23 mg/dL (ref 8–23)
CO2: 25 mmol/L (ref 22–32)
Calcium: 9.5 mg/dL (ref 8.9–10.3)
Chloride: 103 mmol/L (ref 98–111)
Creatinine, Ser: 0.79 mg/dL (ref 0.44–1.00)
GFR, Estimated: >60 mL/min (ref >60)
Glucose, Bld: 91 mg/dL (ref 70–99)
Potassium: 3.5 mmol/L (ref 3.5–5.1)
Sodium: 137 mmol/L (ref 135–145)
Total Bilirubin: 0.7 mg/dL (ref 0.3–1.2)
Total Protein: 7.6 g/dL (ref 6.5–8.1)

## 2021-11-27 LAB — URINALYSIS, ROUTINE W REFLEX MICROSCOPIC
Bilirubin Urine: NEGATIVE
Glucose, UA: NEGATIVE mg/dL
Hgb urine dipstick: NEGATIVE
Ketones, ur: NEGATIVE mg/dL
Nitrite: NEGATIVE
Protein, ur: NEGATIVE mg/dL
Specific Gravity, Urine: 1.013 (ref 1.005–1.030)
pH: 7 (ref 5.0–8.0)

## 2021-11-27 LAB — CBC WITH DIFFERENTIAL/PLATELET
Abs Immature Granulocytes: 0.03 10*3/uL (ref 0.00–0.07)
Basophils Absolute: 0.1 10*3/uL (ref 0.0–0.1)
Basophils Relative: 1 %
Eosinophils Absolute: 0.2 10*3/uL (ref 0.0–0.5)
Eosinophils Relative: 3 %
HCT: 36.6 % (ref 36.0–46.0)
Hemoglobin: 12 g/dL (ref 12.0–15.0)
Immature Granulocytes: 0 %
Lymphocytes Relative: 29 %
Lymphs Abs: 2.1 10*3/uL (ref 0.7–4.0)
MCH: 28.8 pg (ref 26.0–34.0)
MCHC: 32.8 g/dL (ref 30.0–36.0)
MCV: 88 fL (ref 80.0–100.0)
Monocytes Absolute: 0.5 10*3/uL (ref 0.1–1.0)
Monocytes Relative: 7 %
Neutro Abs: 4.3 10*3/uL (ref 1.7–7.7)
Neutrophils Relative %: 60 %
Platelets: 285 10*3/uL (ref 150–400)
RBC: 4.16 MIL/uL (ref 3.87–5.11)
RDW: 13.7 % (ref 11.5–15.5)
WBC: 7.3 10*3/uL (ref 4.0–10.5)
nRBC: 0 % (ref 0.0–0.2)

## 2021-11-27 LAB — TYPE AND SCREEN
ABO/RH(D): O POS
Antibody Screen: NEGATIVE

## 2021-11-27 LAB — SURGICAL PCR SCREEN
MRSA, PCR: NEGATIVE
Staphylococcus aureus: NEGATIVE

## 2021-11-27 NOTE — Patient Instructions (Addendum)
Your procedure is scheduled on:12-07-21 Thursday Report to the Registration Desk on the 1st floor of the [facility].Then proceed to the 2nd floor Surgery Desk in the Medical [facility] To find out your arrival time, please call [phone] between 1PM - 3PM on:12-06-21 Wednesday  REMEMBER: Instructions that are not followed completely may result in serious medical risk, up to and including death; or upon the discretion of your surgeon and anesthesiologist your surgery may need to be rescheduled.  Do not eat food after midnight the night before surgery.  No gum chewing, lozengers or hard candies.  You may however, drink CLEAR liquids up to 2 hours before you are scheduled to arrive for your surgery. Do not drink anything within 2 hours of your scheduled arrival time.  Clear liquids include: - water  - apple juice without pulp - gatorade (not RED colors) - black coffee or tea (Do NOT add milk or creamers to the coffee or tea) Do NOT drink anything that is not on this list.  In addition, your doctor has ordered for you to drink the provided  Ensure Pre-Surgery Clear Carbohydrate Drink  Drinking this carbohydrate drink up to two hours before surgery helps to reduce insulin resistance and improve patient outcomes. Please complete drinking 2 hours prior to scheduled arrival time.  TAKE THESE MEDICATIONS THE MORNING OF SURGERY WITH A SIP OF WATER: -levothyroxine (SYNTHROID, LEVOTHROID)  -pantoprazole (PROTONIX)-take one the night before and one on the morning of surgery - helps to prevent nausea after surgery.)   One week prior to surgery:(Last dose on 11-29-21 Wednesday) Stop Anti-inflammatories (NSAIDS) such as Advil, Aleve, Ibuprofen, Motrin, Naproxen, Naprosyn and Aspirin based products such as Excedrin, Goodys Powder, BC Powder.You may however, continue to take Tylenol if needed for pain up until the day of surgery.  Stop ANY OVER THE COUNTER supplements until after surgery 7 days prior to  surgery-(VITAMIN D, Fish Oil, Multiple Vitamin, vitamin E )  No Alcohol for 24 hours before or after surgery.  No Smoking including e-cigarettes for 24 hours prior to surgery.  No chewable tobacco products for at least 6 hours prior to surgery.  No nicotine patches on the day of surgery.  Do not use any "recreational" drugs for at least a week prior to your surgery.  Please be advised that the combination of cocaine and anesthesia may have negative outcomes, up to and including death. If you test positive for cocaine, your surgery will be cancelled.  On the morning of surgery brush your teeth with toothpaste and water, you may rinse your mouth with mouthwash if you wish. Do not swallow any toothpaste or mouthwash.  Use CHG Soap as directed on instruction sheet.  Total Shoulder Arthroplasty:  use Benzolyl Peroxide 5% Gel as directed on instruction sheet  Do not wear jewelry, make-up, hairpins, clips or nail polish.  Do not wear lotions, powders, or perfumes.   Do not shave body from the neck down 48 hours prior to surgery just in case you cut yourself which could leave a site for infection.  Also, freshly shaved skin may become irritated if using the CHG soap.  Contact lenses, hearing aids and dentures may not be worn into surgery.  Do not bring valuables to the hospital. [facility] is not responsible for any missing/lost belongings or valuables.   Notify your doctor if there is any change in your medical condition (cold, fever, infection).  Wear comfortable clothing (specific to your surgery type) to the hospital.  After surgery, you can help prevent lung complications by doing breathing exercises.  Take deep breaths and cough every 1-2 hours. Your doctor may order a device called an Incentive Spirometer to help you take deep breaths. When coughing or sneezing, hold a pillow firmly against your incision with both hands. This is called splinting. Doing this helps protect your  incision. It also decreases belly discomfort.  If you are being admitted to the hospital overnight, leave your suitcase in the car. After surgery it may be brought to your room.  If you are being discharged the day of surgery, you will not be allowed to drive home. You will need a responsible adult (18 years or older) to drive you home and stay with you that night.   If you are taking public transportation, you will need to have a responsible adult (18 years or older) with you. Please confirm with your physician that it is acceptable to use public transportation.   Please call the Pre-admissions Testing Dept. at [phone] if you have any questions about these instructions.  Surgery Visitation Policy:  Patients undergoing a surgery or procedure may have one family member or support person with them as long as that person is not COVID-19 positive or experiencing its symptoms.  That person may remain in the waiting area during the procedure and may rotate out with other people.  Inpatient Visitation:    Visiting hours are 7 a.m. to 8 p.m. Up to two visitors ages 16+ are allowed at one time in a patient room. The visitors may rotate out with other people during the day. Visitors must check out when they leave, or other visitors will not be allowed. One designated support person may remain overnight. The visitor must pass COVID-19 screenings, use hand sanitizer when entering and exiting the patients room and wear a mask at all times, including in the patients room. Patients must also wear a mask when staff or their visitor are in the room. Masking is required regardless of vaccination status.

## 2021-11-29 NOTE — Progress Notes (Signed)
Felicia Henderson is scheduled to undergo a reverse left total shoulder arthroplasty.  This is a major procedure with the potential for significant blood loss.  Therefore, I feel it is medically necessary that the patient's preoperative H&H is known, and that she has sufficient platelets to initiate her clotting cascade.  Furthermore, because this is an artificial joint being implanted, it is important to know that she does not have an elevated white count which might suggest an occult infection that could possibly affect her new implant.

## 2021-12-05 ENCOUNTER — Other Ambulatory Visit: Admission: RE | Admit: 2021-12-05 | Payer: Medicare Other | Source: Ambulatory Visit

## 2021-12-06 ENCOUNTER — Other Ambulatory Visit
Admission: RE | Admit: 2021-12-06 | Discharge: 2021-12-06 | Disposition: A | Discharge status: Home / Self Care | Payer: Medicare Other | Source: Ambulatory Visit | Attending: Surgery | Admitting: Surgery

## 2021-12-06 ENCOUNTER — Other Ambulatory Visit: Payer: Self-pay

## 2021-12-06 DIAGNOSIS — Z20822 Contact with and (suspected) exposure to covid-19: Secondary | ICD-10-CM | POA: Insufficient documentation

## 2021-12-06 DIAGNOSIS — Z01812 Encounter for preprocedural laboratory examination: Secondary | ICD-10-CM | POA: Diagnosis present

## 2021-12-06 LAB — SARS CORONAVIRUS 2 (TAT 6-24 HRS): SARS Coronavirus 2: NEGATIVE

## 2021-12-06 MED ORDER — LACTATED RINGERS IV SOLN: INTRAVENOUS | Status: DC

## 2021-12-06 MED ORDER — ORAL CARE MOUTH RINSE: 15.0000 mL | Freq: Once | OROMUCOSAL | Status: AC

## 2021-12-06 MED ORDER — CEFAZOLIN SODIUM-DEXTROSE 2-4 GM/100ML-% IV SOLN
2.0000 g | INTRAVENOUS | Status: AC
Start: 2021-12-07 — End: 2021-12-07
  Administered 2021-12-07: 2 g via INTRAVENOUS

## 2021-12-06 MED ORDER — CHLORHEXIDINE GLUCONATE 0.12 % MT SOLN: 15.0000 mL | Freq: Once | OROMUCOSAL | Status: AC

## 2021-12-07 ENCOUNTER — Encounter: Payer: Self-pay | Admitting: Surgery

## 2021-12-07 ENCOUNTER — Inpatient Hospital Stay: Payer: Medicare Other | Admitting: Anesthesiology

## 2021-12-07 ENCOUNTER — Encounter
Admission: RE | Disposition: A | Discharge status: Home / Self Care | Payer: Self-pay | Source: Home / Self Care | Attending: Surgery

## 2021-12-07 ENCOUNTER — Other Ambulatory Visit: Payer: Self-pay

## 2021-12-07 ENCOUNTER — Inpatient Hospital Stay: Payer: Medicare Other

## 2021-12-07 ENCOUNTER — Inpatient Hospital Stay: Payer: Medicare Other | Admitting: Urgent Care

## 2021-12-07 ENCOUNTER — Ambulatory Visit
Admission: RE | Admit: 2021-12-07 | Discharge: 2021-12-07 | Disposition: A | Discharge status: Home / Self Care | Payer: Medicare Other | Attending: Surgery | Admitting: Surgery

## 2021-12-07 DIAGNOSIS — M19012 Primary osteoarthritis, left shoulder: Secondary | ICD-10-CM | POA: Diagnosis not present

## 2021-12-07 DIAGNOSIS — Z79890 Hormone replacement therapy (postmenopausal): Secondary | ICD-10-CM | POA: Insufficient documentation

## 2021-12-07 DIAGNOSIS — Z79899 Other long term (current) drug therapy: Secondary | ICD-10-CM | POA: Insufficient documentation

## 2021-12-07 DIAGNOSIS — Z96612 Presence of left artificial shoulder joint: Secondary | ICD-10-CM

## 2021-12-07 DIAGNOSIS — K219 Gastro-esophageal reflux disease without esophagitis: Secondary | ICD-10-CM | POA: Diagnosis not present

## 2021-12-07 DIAGNOSIS — M75102 Unspecified rotator cuff tear or rupture of left shoulder, not specified as traumatic: Secondary | ICD-10-CM | POA: Diagnosis present

## 2021-12-07 DIAGNOSIS — I10 Essential (primary) hypertension: Secondary | ICD-10-CM | POA: Diagnosis not present

## 2021-12-07 DIAGNOSIS — E039 Hypothyroidism, unspecified: Secondary | ICD-10-CM | POA: Insufficient documentation

## 2021-12-07 DIAGNOSIS — Z87891 Personal history of nicotine dependence: Secondary | ICD-10-CM | POA: Insufficient documentation

## 2021-12-07 DIAGNOSIS — Z419 Encounter for procedure for purposes other than remedying health state, unspecified: Secondary | ICD-10-CM

## 2021-12-07 DIAGNOSIS — R7303 Prediabetes: Secondary | ICD-10-CM | POA: Diagnosis not present

## 2021-12-07 HISTORY — PX: REVERSE SHOULDER ARTHROPLASTY: SHX5054

## 2021-12-07 SURGERY — ARTHROPLASTY, SHOULDER, TOTAL, REVERSE
Anesthesia: General | Site: Shoulder | Laterality: Left

## 2021-12-07 MED ORDER — TRANEXAMIC ACID 1000 MG/10ML IV SOLN
INTRAVENOUS | Status: AC
  Filled 2021-12-07: qty 10

## 2021-12-07 MED ORDER — DROPERIDOL 2.5 MG/ML IJ SOLN
0.6250 mg | Freq: Once | INTRAMUSCULAR | Status: DC | PRN
  Filled 2021-12-07: qty 0.25

## 2021-12-07 MED ORDER — FENTANYL CITRATE (PF) 100 MCG/2ML IJ SOLN
INTRAMUSCULAR | Status: AC
  Filled 2021-12-07: qty 2

## 2021-12-07 MED ORDER — BUPIVACAINE LIPOSOME 1.3 % IJ SUSP
INTRAMUSCULAR | Status: DC | PRN
  Administered 2021-12-07: 10 mL via PERINEURAL

## 2021-12-07 MED ORDER — BUPIVACAINE LIPOSOME 1.3 % IJ SUSP
INTRAMUSCULAR | Status: AC
  Filled 2021-12-07: qty 10

## 2021-12-07 MED ORDER — DEXMEDETOMIDINE (PRECEDEX) IN NS 20 MCG/5ML (4 MCG/ML) IV SYRINGE
PREFILLED_SYRINGE | INTRAVENOUS | Status: DC | PRN
  Administered 2021-12-07 (×5): 4 ug via INTRAVENOUS

## 2021-12-07 MED ORDER — ONDANSETRON HCL 4 MG/2ML IJ SOLN
INTRAMUSCULAR | Status: AC
  Filled 2021-12-07: qty 2

## 2021-12-07 MED ORDER — FENTANYL CITRATE PF 50 MCG/ML IJ SOSY
PREFILLED_SYRINGE | INTRAMUSCULAR | Status: AC
  Administered 2021-12-07: 50 ug via INTRAVENOUS
  Filled 2021-12-07: qty 1

## 2021-12-07 MED ORDER — TRANEXAMIC ACID 1000 MG/10ML IV SOLN
INTRAVENOUS | Status: DC | PRN
  Administered 2021-12-07: 1000 mg via TOPICAL

## 2021-12-07 MED ORDER — LIDOCAINE HCL (PF) 2 % IJ SOLN
INTRAMUSCULAR | Status: AC
  Filled 2021-12-07: qty 5

## 2021-12-07 MED ORDER — OXYCODONE HCL 5 MG PO TABS: 5.0000 mg | ORAL_TABLET | Freq: Once | ORAL | Status: DC | PRN

## 2021-12-07 MED ORDER — CEFAZOLIN SODIUM-DEXTROSE 2-4 GM/100ML-% IV SOLN
2.0000 g | Freq: Four times a day (QID) | INTRAVENOUS | Status: DC
  Administered 2021-12-07: 2 g via INTRAVENOUS

## 2021-12-07 MED ORDER — DROPERIDOL 2.5 MG/ML IJ SOLN: 0.6250 mg | Freq: Once | INTRAMUSCULAR | Status: DC | PRN

## 2021-12-07 MED ORDER — KETOROLAC TROMETHAMINE 15 MG/ML IJ SOLN
15.0000 mg | Freq: Once | INTRAMUSCULAR | Status: AC
  Administered 2021-12-07: 15 mg via INTRAVENOUS

## 2021-12-07 MED ORDER — PROPOFOL 10 MG/ML IV BOLUS
INTRAVENOUS | Status: DC | PRN
  Administered 2021-12-07: 110 mg via INTRAVENOUS
  Administered 2021-12-07: 20 mg via INTRAVENOUS

## 2021-12-07 MED ORDER — FENTANYL CITRATE (PF) 100 MCG/2ML IJ SOLN
25.0000 ug | INTRAMUSCULAR | Status: DC | PRN
  Administered 2021-12-07: 25 ug via INTRAVENOUS
  Administered 2021-12-07: 50 ug via INTRAVENOUS

## 2021-12-07 MED ORDER — METOCLOPRAMIDE HCL 10 MG PO TABS: 5.0000 mg | ORAL_TABLET | Freq: Three times a day (TID) | ORAL | Status: DC | PRN

## 2021-12-07 MED ORDER — LIDOCAINE HCL (PF) 1 % IJ SOLN
INTRAMUSCULAR | Status: DC | PRN
  Administered 2021-12-07: 1 mL via SUBCUTANEOUS

## 2021-12-07 MED ORDER — SODIUM CHLORIDE FLUSH 0.9 % IV SOLN
INTRAVENOUS | Status: AC
Start: 2021-12-07 — End: ?
  Filled 2021-12-07: qty 20

## 2021-12-07 MED ORDER — KETOROLAC TROMETHAMINE 15 MG/ML IJ SOLN
INTRAMUSCULAR | Status: AC
  Filled 2021-12-07: qty 1

## 2021-12-07 MED ORDER — ROCURONIUM BROMIDE 10 MG/ML (PF) SYRINGE
PREFILLED_SYRINGE | INTRAVENOUS | Status: AC
  Filled 2021-12-07: qty 10

## 2021-12-07 MED ORDER — EPHEDRINE 5 MG/ML INJ
INTRAVENOUS | Status: AC
  Filled 2021-12-07: qty 5

## 2021-12-07 MED ORDER — SODIUM CHLORIDE 0.9 % IV SOLN: INTRAVENOUS | Status: DC

## 2021-12-07 MED ORDER — METOCLOPRAMIDE HCL 5 MG/ML IJ SOLN: 5.0000 mg | Freq: Three times a day (TID) | INTRAMUSCULAR | Status: DC | PRN

## 2021-12-07 MED ORDER — PHENYLEPHRINE HCL (PRESSORS) 10 MG/ML IV SOLN
INTRAVENOUS | Status: DC | PRN
Start: 2021-12-07 — End: 2021-12-07
  Administered 2021-12-07: 80 ug via INTRAVENOUS

## 2021-12-07 MED ORDER — ROCURONIUM BROMIDE 100 MG/10ML IV SOLN
INTRAVENOUS | Status: DC | PRN
  Administered 2021-12-07 (×2): 40 mg via INTRAVENOUS
  Administered 2021-12-07: 20 mg via INTRAVENOUS

## 2021-12-07 MED ORDER — DEXAMETHASONE SODIUM PHOSPHATE 10 MG/ML IJ SOLN
INTRAMUSCULAR | Status: AC
  Filled 2021-12-07: qty 1

## 2021-12-07 MED ORDER — KETOROLAC TROMETHAMINE 15 MG/ML IJ SOLN: 7.5000 mg | Freq: Four times a day (QID) | INTRAMUSCULAR | Status: DC

## 2021-12-07 MED ORDER — CEFAZOLIN SODIUM-DEXTROSE 2-4 GM/100ML-% IV SOLN
INTRAVENOUS | Status: AC
  Filled 2021-12-07: qty 100

## 2021-12-07 MED ORDER — SUGAMMADEX SODIUM 200 MG/2ML IV SOLN
INTRAVENOUS | Status: DC | PRN
Start: 2021-12-07 — End: 2021-12-07
  Administered 2021-12-07: 100 mg via INTRAVENOUS
  Administered 2021-12-07 (×2): 50 mg via INTRAVENOUS

## 2021-12-07 MED ORDER — OXYCODONE HCL 5 MG PO TABS: 5.0000 mg | ORAL_TABLET | ORAL | Status: DC | PRN

## 2021-12-07 MED ORDER — BUPIVACAINE HCL (PF) 0.5 % IJ SOLN
INTRAMUSCULAR | Status: AC
  Filled 2021-12-07: qty 10

## 2021-12-07 MED ORDER — FENTANYL CITRATE (PF) 100 MCG/2ML IJ SOLN: 25.0000 ug | INTRAMUSCULAR | Status: DC | PRN

## 2021-12-07 MED ORDER — ACETAMINOPHEN 10 MG/ML IV SOLN: 1000.0000 mg | Freq: Once | INTRAVENOUS | Status: DC | PRN

## 2021-12-07 MED ORDER — ONDANSETRON HCL 4 MG PO TABS
4.0000 mg | ORAL_TABLET | Freq: Four times a day (QID) | ORAL | Status: DC | PRN
Start: 2021-12-07 — End: 2021-12-07

## 2021-12-07 MED ORDER — ONDANSETRON 4 MG PO TBDP: 4.0000 mg | ORAL_TABLET | Freq: Three times a day (TID) | ORAL | 1 refills | Status: AC | PRN

## 2021-12-07 MED ORDER — BUPIVACAINE-EPINEPHRINE (PF) 0.5% -1:200000 IJ SOLN
INTRAMUSCULAR | Status: DC | PRN
  Administered 2021-12-07: 50 mL via INTRAMUSCULAR

## 2021-12-07 MED ORDER — CHLORHEXIDINE GLUCONATE 0.12 % MT SOLN
OROMUCOSAL | Status: AC
  Administered 2021-12-07: 15 mL via OROMUCOSAL
  Filled 2021-12-07: qty 15

## 2021-12-07 MED ORDER — OXYCODONE HCL 5 MG/5ML PO SOLN: 5.0000 mg | Freq: Once | ORAL | Status: DC | PRN

## 2021-12-07 MED ORDER — FENTANYL CITRATE (PF) 100 MCG/2ML IJ SOLN
INTRAMUSCULAR | Status: DC | PRN
  Administered 2021-12-07 (×2): 50 ug via INTRAVENOUS

## 2021-12-07 MED ORDER — FENTANYL CITRATE PF 50 MCG/ML IJ SOSY: 50.0000 ug | PREFILLED_SYRINGE | INTRAMUSCULAR | Status: DC | PRN

## 2021-12-07 MED ORDER — FENTANYL CITRATE (PF) 100 MCG/2ML IJ SOLN
INTRAMUSCULAR | Status: AC
  Administered 2021-12-07: 25 ug via INTRAVENOUS
  Filled 2021-12-07: qty 2

## 2021-12-07 MED ORDER — ONDANSETRON HCL 4 MG/2ML IJ SOLN
INTRAMUSCULAR | Status: DC | PRN
  Administered 2021-12-07: 4 mg via INTRAVENOUS

## 2021-12-07 MED ORDER — LIDOCAINE HCL (CARDIAC) PF 100 MG/5ML IV SOSY
PREFILLED_SYRINGE | INTRAVENOUS | Status: DC | PRN
  Administered 2021-12-07: 60 mg via INTRAVENOUS

## 2021-12-07 MED ORDER — LIDOCAINE HCL (PF) 1 % IJ SOLN
INTRAMUSCULAR | Status: AC
  Filled 2021-12-07: qty 5

## 2021-12-07 MED ORDER — 0.9 % SODIUM CHLORIDE (POUR BTL) OPTIME
TOPICAL | Status: DC | PRN
  Administered 2021-12-07: 500 mL

## 2021-12-07 MED ORDER — BUPIVACAINE HCL (PF) 0.5 % IJ SOLN
INTRAMUSCULAR | Status: DC | PRN
  Administered 2021-12-07: 10 mL via PERINEURAL

## 2021-12-07 MED ORDER — OXYCODONE HCL 5 MG PO TABS: 5.0000 mg | ORAL_TABLET | ORAL | 0 refills | Status: AC | PRN

## 2021-12-07 MED ORDER — BUPIVACAINE-EPINEPHRINE (PF) 0.5% -1:200000 IJ SOLN
INTRAMUSCULAR | Status: AC
  Filled 2021-12-07: qty 30

## 2021-12-07 MED ORDER — SODIUM CHLORIDE 0.9 % IR SOLN
Status: DC | PRN
  Administered 2021-12-07: 3000 mL

## 2021-12-07 MED ORDER — EPHEDRINE SULFATE (PRESSORS) 50 MG/ML IJ SOLN
INTRAMUSCULAR | Status: DC | PRN
  Administered 2021-12-07 (×2): 5 mg via INTRAVENOUS

## 2021-12-07 MED ORDER — DEXAMETHASONE SODIUM PHOSPHATE 10 MG/ML IJ SOLN
INTRAMUSCULAR | Status: DC | PRN
Start: 2021-12-07 — End: 2021-12-07
  Administered 2021-12-07: 8 mg via INTRAVENOUS

## 2021-12-07 MED ORDER — PHENYLEPHRINE HCL-NACL 20-0.9 MG/250ML-% IV SOLN
INTRAVENOUS | Status: DC | PRN
Start: 2021-12-07 — End: 2021-12-07
  Administered 2021-12-07: 50 ug/min via INTRAVENOUS

## 2021-12-07 MED ORDER — ONDANSETRON HCL 4 MG/2ML IJ SOLN
4.0000 mg | Freq: Four times a day (QID) | INTRAMUSCULAR | Status: DC | PRN
Start: 2021-12-07 — End: 2021-12-07

## 2021-12-07 SURGICAL SUPPLY — 70 items
APL PRP STRL LF DISP 70% ISPRP (MISCELLANEOUS) ×2
BASEPLATE BOSS DRILL (MISCELLANEOUS) ×1 IMPLANT
BIT DRILL 2.5 (BIT) ×2
BIT DRILL 2.5X4.5XSCR (BIT) IMPLANT
BIT DRILL BASEPLATE CENTRAL S (BIT) ×1 IMPLANT
BIT DRL 2.5X4.5XSCR (BIT) ×1
BLADE SAW SAG 25X90X1.19 (BLADE) ×2 IMPLANT
BNDG COHESIVE 4X5 TAN ST LF (GAUZE/BANDAGES/DRESSINGS) ×1 IMPLANT
BSPLAT GLND THK2 22.8X27.3 (Plate) ×1 IMPLANT
CHLORAPREP W/TINT 26 (MISCELLANEOUS) ×3 IMPLANT
COOLER POLAR GLACIER W/PUMP (MISCELLANEOUS) ×2 IMPLANT
COVER BACK TABLE REUSABLE LG (DRAPES) ×2 IMPLANT
DRAPE 3/4 80X56 (DRAPES) ×2 IMPLANT
DRAPE INCISE IOBAN 66X45 STRL (DRAPES) ×2 IMPLANT
DRSG OPSITE POSTOP 4X8 (GAUZE/BANDAGES/DRESSINGS) ×2 IMPLANT
ELECT BLADE 6.5 EXT (BLADE) IMPLANT
ELECT CAUTERY BLADE 6.4 (BLADE) ×2 IMPLANT
ELECT REM PT RETURN 9FT ADLT (ELECTROSURGICAL) ×2
ELECTRODE REM PT RTRN 9FT ADLT (ELECTROSURGICAL) ×1 IMPLANT
GAUZE XEROFORM 1X8 LF (GAUZE/BANDAGES/DRESSINGS) ×2 IMPLANT
GLENOSPHERE RSS 2 CONCENTRIC (Shoulder) ×1 IMPLANT
GLOVE SRG 8 PF TXTR STRL LF DI (GLOVE) ×2 IMPLANT
GLOVE SURG ENC MOIS LTX SZ7.5 (GLOVE) ×9 IMPLANT
GLOVE SURG ENC MOIS LTX SZ8 (GLOVE) ×8 IMPLANT
GLOVE SURG UNDER LTX SZ8 (GLOVE) ×2 IMPLANT
GLOVE SURG UNDER POLY LF SZ8 (GLOVE) ×8
GOWN STRL REUS W/ TWL LRG LVL3 (GOWN DISPOSABLE) ×1 IMPLANT
GOWN STRL REUS W/ TWL XL LVL3 (GOWN DISPOSABLE) ×1 IMPLANT
GOWN STRL REUS W/TWL LRG LVL3 (GOWN DISPOSABLE) ×2
GOWN STRL REUS W/TWL XL LVL3 (GOWN DISPOSABLE) ×2
GUIDE PIN 2.0 X 150 (WIRE) ×1 IMPLANT
IV NS IRRIG 3000ML ARTHROMATIC (IV SOLUTION) ×2 IMPLANT
KIT STABILIZATION SHOULDER (MISCELLANEOUS) ×2 IMPLANT
KIT TURNOVER KIT A (KITS) ×2 IMPLANT
LINER STD +0S RSS HXL (Liner) ×1 IMPLANT
MANIFOLD NEPTUNE II (INSTRUMENTS) ×2 IMPLANT
MASK FACE SPIDER DISP (MASK) ×2 IMPLANT
MAT ABSORB  FLUID 56X50 GRAY (MISCELLANEOUS) ×1
MAT ABSORB FLUID 56X50 GRAY (MISCELLANEOUS) ×1 IMPLANT
NDL SAFETY ECLIPSE 18X1.5 (NEEDLE) ×1 IMPLANT
NDL SPNL 20GX3.5 QUINCKE YW (NEEDLE) ×1 IMPLANT
NEEDLE HYPO 18GX1.5 SHARP (NEEDLE)
NEEDLE SPNL 20GX3.5 QUINCKE YW (NEEDLE) ×2 IMPLANT
NS IRRIG 500ML POUR BTL (IV SOLUTION) ×2 IMPLANT
PACK ARTHROSCOPY SHOULDER (MISCELLANEOUS) ×2 IMPLANT
PAD ARMBOARD 7.5X6 YLW CONV (MISCELLANEOUS) ×2 IMPLANT
PAD WRAPON POLAR SHDR UNIV (MISCELLANEOUS) ×1 IMPLANT
PLATE BASE REVERSE RSS S (Plate) ×1 IMPLANT
PULSAVAC PLUS IRRIG FAN TIP (DISPOSABLE) ×2
SCREW 4.5X15 RSS W CAP (Screw) ×3 IMPLANT
SCREW 4.5X25 RSS W CAP (Screw) ×1 IMPLANT
SCREW 4.5X35 RSS W CAP (Screw) ×1 IMPLANT
SCREW BODY REVERSE STD (Screw) ×1 IMPLANT
SLING ULTRA II M (MISCELLANEOUS) IMPLANT
SPONGE T-LAP 18X18 ~~LOC~~+RFID (SPONGE) ×4 IMPLANT
STAPLER SKIN PROX 35W (STAPLE) ×2 IMPLANT
STEM PRESS FIT SZ 10 TSS (Stem) ×1 IMPLANT
SUT ETHIBOND 0 MO6 C/R (SUTURE) ×1 IMPLANT
SUT FIBERWIRE #2 38 BLUE 1/2 (SUTURE)
SUT VIC AB 0 CT1 36 (SUTURE) ×2 IMPLANT
SUT VIC AB 2-0 CT1 (SUTURE) ×1 IMPLANT
SUT VIC AB 2-0 CT1 27 (SUTURE) ×4
SUT VIC AB 2-0 CT1 TAPERPNT 27 (SUTURE) ×2 IMPLANT
SUTURE FIBERWR #2 38 BLUE 1/2 (SUTURE) ×4 IMPLANT
SYR 10ML LL (SYRINGE) ×2 IMPLANT
SYR 30ML LL (SYRINGE) ×2 IMPLANT
TIP FAN IRRIG PULSAVAC PLUS (DISPOSABLE) ×1 IMPLANT
WATER STERILE IRR 1000ML POUR (IV SOLUTION) ×1 IMPLANT
WATER STERILE IRR 500ML POUR (IV SOLUTION) ×1 IMPLANT
WRAPON POLAR PAD SHDR UNIV (MISCELLANEOUS) ×2

## 2021-12-07 NOTE — H&P (Signed)
History of Present Illness: ?Felicia Henderson is a 76 y.o. female who presents today for her surgical history and physical for upcoming left reverse total shoulder arthroplasty. Surgery scheduled with Dr. Joice Lofts on 12/07/2021. The patient denies any trauma or injury affecting her left shoulder since surgery. She denies any signs of infection at home such as fevers chills or cough. The patient denies any personal history of heart attack, stroke, asthma or COPD. No personal history of blood clots. She is right-hand dominant. She does report a moderate discomfort in the left shoulder, pain score is a 4 out of 10 to the left shoulder at today's visit. The patient denies any changes to her medical history since she was last evaluated. ? ?Past Medical History: ? Acquired hypothyroidism, unspecified 10/02/2018  ? HTN, goal below 140/90 10/02/2018  ? Hypercholesterolemia 10/02/2018  ? ?Past Surgical History: ? L4-5 laminectomy 03/09/2019 (Dr Lucy Chris at [facility])  ? APPENDECTOMY  ? TONSILLECTOMY  ? ?Past Family History: ? Coronary Artery Disease (Blocked arteries around heart) Mother  ? Breast cancer Mother  ? Coronary Artery Disease (Blocked arteries around heart) Father  ? ?Medications: ? cholecalciferol (VITAMIN D3) 1000 unit tablet Take 1,000 Units by mouth once daily  ? gabapentin (NEURONTIN) 300 MG capsule Take 300 mg by mouth at bedtime  ? ibuprofen (MOTRIN) 200 MG tablet Take by mouth Take 400 mg by mouth every 6 (six) hours as needed for headache or moderate pain.  ? levothyroxine (SYNTHROID) 75 MCG tablet TAKE 1 TABLET ONCE DAILY ONEMPTY STOMACH WITH A GLASS OF WATER AT LEAST 30-60 MINS BEFORE BREAKFAST 90 tablet 1  ? lisinopriL (ZESTRIL) 10 MG tablet TAKE 1 TABLET BY MOUTH EVERY DAY 90 tablet 1  ? multivitamin with iron/minerals (THERADEX-M) 27-0.4 mg tablet Take by mouth  ? omega 3-dha-epa-fish oil 1,000 mg (120 mg-180 mg) Cap Take 1,000 mg by mouth once daily  ? pantoprazole (PROTONIX) 40 MG DR tablet TAKE 1  TABLET BY MOUTH EVERY DAY 90 tablet 3  ? vitamin E 400 UNIT capsule Take by mouth Take 400 Units by mouth daily.  ? ?Allergies: ?No Known Allergies  ? ?Review of Systems:  ?A comprehensive 14 point ROS was performed, reviewed by me today, and the pertinent orthopaedic findings are documented in the HPI. ? ?Physical Exam: ?BP 130/82  Ht 157.5 cm (5\' 2" )  Wt 77.2 kg (170 lb 3.2 oz)  BMI 31.13 kg/m?  ?General/Constitutional: The patient appears to be well-nourished, well-developed, and in no acute distress. ?Neuro/Psych: Normal mood and affect, oriented to person, place and time. ?Eyes: Non-icteric. Pupils are equal, round, and reactive to light, and exhibit synchronous movement. ?ENT: Unremarkable. ?Lymphatic: No palpable adenopathy. ?Respiratory: Lungs clear to auscultation, Normal chest excursion, No wheezes and Non-labored breathing ?Cardiovascular: Regular rate and rhythm. No murmurs. and No edema, swelling or tenderness, except as noted in detailed exam. ?Integumentary: No impressive skin lesions present, except as noted in detailed exam. ?Musculoskeletal: Unremarkable, except as noted in detailed exam. ? ?Left shoulder exam: ?SKIN: normal ?SWELLING: none ?WARMTH: none ?LYMPH NODES: no adenopathy palpable ?CREPITUS: none ?TENDERNESS: Minimally tender over anterior shoulder ?ROM (active):  ?Forward flexion: 150 degrees ?Abduction: 145 ?Internal rotation: L3 ?ROM (passive):  ?Forward flexion: 160 degrees ?Abduction: 150 degrees  ?ER/IR at 90 abd: 90 degrees / 60 degrees ?  ?She notes mild pain at the with internal rotation at 90 degrees of abduction, and minimal discomfort with all other motions. ?  ?STRENGTH: Forward flexion: 4/5 ?Abduction: 4/5 ?External  rotation: 4/5 ?Internal rotation: 4/5 ?Pain with RC testing: No ?  ?STABILITY: Normal ?  ?SPECIAL TESTS: Juanetta Gosling' test: Minimally positive ?Speed's test: Not evaluated ?Capsulitis - pain w/ passive ER: no ?Crossed arm test: Negative ?Crank: Not  evaluated ?Anterior apprehension: Negative ?Posterior apprehension: Not evaluated ?  ?She is neurovascularly intact to the left upper extremity. ?  ?Imaging:   ?Shoulder X-Ray Imaging: ?Recent true AP and Y-scapular views of the left shoulder are available for review and have been reviewed by myself these films demonstrate no evidence for fractures, lytic lesions, or significant degenerative changes. The subacromial space is mildly decreased. There is no subacromial or infra-clavicular spurring. She demonstrates a Type II acromion. ?  ?Left Shoulder Imaging, MRI: ?MRI Shoulder Cartilage: Partial thickness humeral head cartilage loss. Partial thickness glenoid cartilage loss. ?MRI Shoulder Rotator Cuff: Full-thickness tears of the supraspinatus and subscapularis tendons, and moderate tendinopathy of the infraspinatus tendon. Retracted to the glenoid. ?MRI Shoulder Labrum / Biceps: The long head of the biceps tendon has been chronically torn and retracted distally. ?MRI Shoulder Bone: Normal bone. ? ?Impression: ?Primary osteoarthritis of left shoulder.  ?Nontraumatic complete tear of left rotator cuff. ? ?Plan:  ?1. Treatment options were discussed today with the patient. ?2. The patient is scheduled for a left reverse total shoulder arthroplasty scheduled Dr. Joice Lofts on 12/07/2021. ?3. The patient was instructed on the risk and benefits of surgery and wishes to proceed at this time. She was offered and received a left shoulder slingshot shoulder sling which she will need to wear following surgery. ?4. This document will serve as a surgical history and physical for the patient. ?5. The patient will follow-up per standard postop protocol. They can call the clinic they have any questions, new symptoms develop or symptoms worsen. ? ?The procedure was discussed with the patient, as were the potential risks (including bleeding, infection, nerve and/or blood vessel injury, persistent or recurrent pain, failure of the hardware,  axillary nerve injury, dislocation, stress fracture, need for further surgery, blood clots, strokes, heart attacks and/or arhythmias, pneumonia, etc.) and benefits. The patient states her understanding and wishes to proceed. ? ? ?H&P reviewed and patient re-examined. No changes. ? ?

## 2021-12-07 NOTE — Anesthesia Procedure Notes (Signed)
Procedure Name: Intubation ?Date/Time: 12/07/2021 10:20 AM ?Performed by: Nelda Marseille, CRNA ?Pre-anesthesia Checklist: Patient identified, Patient being monitored, Timeout performed, Emergency Drugs available and Suction available ?Patient Re-evaluated:Patient Re-evaluated prior to induction ?Oxygen Delivery Method: Circle system utilized ?Preoxygenation: Pre-oxygenation with 100% oxygen ?Induction Type: IV induction ?Ventilation: Mask ventilation without difficulty ?Laryngoscope Size: Mac, 3 and McGraph ?Grade View: Grade I ?Tube type: Oral ?Tube size: 7.0 mm ?Number of attempts: 1 ?Airway Equipment and Method: Stylet ?Placement Confirmation: ETT inserted through vocal cords under direct vision, positive ETCO2 and breath sounds checked- equal and bilateral ?Secured at: 21 cm ?Tube secured with: Tape ?Dental Injury: Teeth and Oropharynx as per pre-operative assessment  ? ? ? ? ?

## 2021-12-07 NOTE — Op Note (Signed)
12/07/2021 ? ?12:26 PM ? ?Patient:   Felicia Henderson ? ?Pre-Op Diagnosis:   Massive irreparable rotator cuff tear with cuff arthropathy, left shoulder. ? ?Post-Op Diagnosis:   Same. ? ?Procedure:   Reverse left total shoulder arthroplasty with biceps tenodesis. ? ?Surgeon:   Maryagnes Amos, MD ? ?Assistant:   Horris Latino, PA-C ? ?Anesthesia:   General endotracheal with an interscalene block using Exparel placed preoperatively by the anesthesiologist. ? ?Findings:   As above. ? ?Complications:   None ? ?EBL:   75 cc ? ?Fluids:   700 cc crystalloid ? ?UOP:   None ? ?TT:   None ? ?Drains:   None ? ?Closure:   Staples ? ?Implants:   All press-fit Integra system with a 10 mm stem, a standard metaphyseal body, a +0 mm humeral platform, a mini baseplate, and a 38 mm concentric +2 mm laterally offset glenosphere. ? ?Brief Clinical Note:   The patient is a 76 year old female with a long history of progressively worsening pain and weakness of her left shoulder. Her symptoms have progressed despite medications, activity modification, etc. Her history and examination are consistent with a massive irreparable rotator cuff tear with cuff arthropathy, all of which were confirmed by preoperative MRI scanning. The patient presents at this time for a reverse left total shoulder arthroplasty. ? ?Procedure:   The patient underwent placement of an interscalene block using Exparel by the anesthesiologist in the preoperative holding area before being brought into the operating room and lain in the supine position. The patient then underwent general endotracheal intubation and anesthesia before the patient was repositioned in the beach chair position using the beach chair positioner. The left shoulder and upper extremity were prepped with ChloraPrep solution before being draped sterilely. Preoperative antibiotics were administered.  ? ?A timeout was performed to verify the appropriate surgical site before a standard anterior approach  to the shoulder was made through an approximately 4-5 inch incision. The incision was carried down through the subcutaneous tissues to expose the deltopectoral fascia. The interval between the deltoid and pectoralis muscles was identified and this plane developed, retracting the cephalic vein laterally with the deltoid muscle. The conjoined tendon was identified. Its lateral margin was dissected and the Kolbel self-retraining retractor inserted. The "three sisters" were identified and cauterized. Bursal tissues were removed to improve visualization.  ? ?The biceps tendon was unable to be identified as it previously had torn and retracted distally. The subscapularis tendon also was noted to have chronically torn and retracted medially. The inferior capsule was released with care after identifying and protecting the axillary nerve. The proximal humeral cut was made at approximately 20? of retroversion using the extra-medullary guide.  ? ?Attention was redirected to the glenoid. The labrum was debrided circumferentially before the center of the glenoid was identified. The guidewire was drilled into the glenoid neck using the appropriate guide. After verifying its position, it was overreamed with the mini-baseplate reamer to create a flat surface before the stem reamer was utilized. The superior and inferior peg sites were reamed using the appropriate guide to complete the glenoid preparation. The permanent mini-baseplate was impacted into place. It was stabilized with a 25 x 4.5 mm central screw and four peripheral screws. Locking caps were placed over the superior and inferior screws. The permanent 38 mm concentric glenosphere with +2 mm of lateral offset was then impacted into place and its Morse taper locking mechanism verified using manual distraction. ? ?Attention was directed to the humeral  side. The humeral canal was prepared utilizing the tapered stem reamers sequentially beginning with the 7 mm stem and  progressing to a 10 mm stem. This demonstrated a good tight fit. The metaphyseal region was then prepared using the appropriate planar device. The trial stem and standard metaphyseal body were put together on the back table and a trial reduction performed using the +0 mm insert. With the +0 mm insert, the arm demonstrated excellent range of motion as the hand could be brought across the chest to the opposite shoulder and brought to the top of the patient's head and to the patient's ear. The shoulder appeared stable throughout this range of motion. The joint was dislocated and the trial components removed. The permanent 10 mm stem with the standard body was impacted into place with care taken to maintain the appropriate version. A repeat trial reduction with the +0 mm insert again demonstrated excellent stability with the findings as described above. Therefore, the shoulder was re-dislocated and, after inserting the locking screw to secure the body to the stem, the permanent +0 mm insert impacted into place. After verifying its locking mechanism, the shoulder was relocated using two finger pressure and again placed through a range of motion with the findings as described above. ? ?The wound was copiously irrigated with sterile saline solution using the jet lavage system before a total of 10 cc of Exparel diluted out to 40 cc with 30 cc of 0.5% Sensorcaine with epinephrine was injected into the pericapsular and peri-incisional tissues to help with postoperative analgesia. The deltopectoral interval was closed using #0 Vicryl interrupted sutures before the subcutaneous tissues were closed using 2-0 Vicryl interrupted sutures. The skin was closed using staples. Prior to closing the skin, 1 g of transexemic acid in 10 cc of normal saline was injected intra-articularly to help with postoperative bleeding. A sterile occlusive dressing was applied to the wound before the arm was placed into a shoulder immobilizer with an  abduction pillow. A Polar Care system also was applied to the shoulder. The patient was then transferred back to a hospital bed before being awakened, extubated, and returned to the recovery room in satisfactory condition after tolerating the procedure well. ?

## 2021-12-07 NOTE — Anesthesia Preprocedure Evaluation (Addendum)
Anesthesia Evaluation  ?Patient identified by MRN, date of birth, ID band ?Patient awake ? ? ? ?Reviewed: ?Allergy & Precautions, NPO status , Patient's Chart, lab work & pertinent test results ? ?Airway ?Mallampati: III ? ?TM Distance: >3 FB ?Neck ROM: full ? ? ? Dental ? ?(+) Missing,  ?  ?Pulmonary ?neg pulmonary ROS, former smoker,  ?  ?Pulmonary exam normal ? ? ? ? ? ? ? Cardiovascular ?hypertension, Pt. on medications ?Normal cardiovascular exam ? ? ?  ?Neuro/Psych ?negative neurological ROS ? negative psych ROS  ? GI/Hepatic ?Neg liver ROS, GERD  Medicated and Controlled,  ?Endo/Other  ?Hypothyroidism  ? Renal/GU ?  ? ?  ?Musculoskeletal ? ?(+) Arthritis ,  ? Abdominal ?Normal abdominal exam  (+)   ?Peds ? Hematology ?negative hematology ROS ?(+)   ?Anesthesia Other Findings ?Past Medical History: ?No date: Aortic atherosclerosis (HCC) ?No date: Arthritis ?No date: High cholesterol ?No date: Hypertension ?No date: Hypothyroidism ?No date: Pre-diabetes ? ?Past Surgical History: ?No date: APPENDECTOMY ?No date: COLONOSCOPY ?03/09/2019: LUMBAR LAMINECTOMY/DECOMPRESSION MICRODISCECTOMY; N/A ?    Comment:  Procedure: lumbar  laminectomy L4-5;  Surgeon: Adriana Simas,  ?             Viviann Spare, MD;  Location: [facility] ORS;  Service: Neurosurgery;  ?             Laterality: N/A; ?No date: TONSILLECTOMY ? ? ? ? Reproductive/Obstetrics ?negative OB ROS ? ?  ? ? ? ? ? ? ? ? ? ? ? ? ? ?  ?  ? ? ? ? ? ? ? ?Anesthesia Physical ?Anesthesia Plan ? ?ASA: 2 ? ?Anesthesia Plan: General  ? ?Post-op Pain Management: Regional block*  ? ?Induction: Intravenous ? ?PONV Risk Score and Plan: 2 and Dexamethasone and Ondansetron ? ?Airway Management Planned: Oral ETT ? ?Additional Equipment:  ? ?Intra-op Plan:  ? ?Post-operative Plan: Extubation in OR ? ?Informed Consent: I have reviewed the patients History and Physical, chart, labs and discussed the procedure including the risks, benefits and alternatives for the  proposed anesthesia with the patient or authorized representative who has indicated his/her understanding and acceptance.  ? ? ? ?Dental advisory given ? ?Plan Discussed with: Anesthesiologist, CRNA and Surgeon ? ?Anesthesia Plan Comments:   ? ? ? ? ? ?Anesthesia Quick Evaluation ? ?

## 2021-12-07 NOTE — Anesthesia Procedure Notes (Signed)
Anesthesia Regional Block: Interscalene brachial plexus block  ? ?Pre-Anesthetic Checklist: , timeout performed,  Correct Patient, Correct Site, Correct Laterality,  Correct Procedure, Correct Position, site marked,  Risks and benefits discussed,  Surgical consent,  Pre-op evaluation,  At surgeon's request and post-op pain management ? ?Laterality: Upper and Left ? ?Prep: chloraprep     ?  ?Needles:  ?Injection technique: Single-shot ? ?Needle Type: Stimiplex   ? ? ?Needle Length: 9cm  ?Needle Gauge: 22  ? ? ? ?Additional Needles: ? ? ?Procedures:,,,, ultrasound used (permanent image in chart),,    ?Narrative:  ?Start time: 12/07/2021 9:12 AM ?End time: 12/07/2021 9:13 AM ?Injection made incrementally with aspirations every 20 mL. ? ?Performed by: Personally  ?Anesthesiologist: Foye Deer, MD ? ?Additional Notes: ?Patient consented for risk and benefits of nerve block including but not limited to nerve damage, failed block, bleeding and infection.  Patient voiced understanding. ? ?Functioning IV was confirmed and monitors were applied.  Timeout done prior to procedure and prior to any sedation being given to the patient.  Patient confirmed procedure site prior to any sedation given to the patient.  A 56mm 22ga Stimuplex needle was used. Sterile prep,hand hygiene and sterile gloves were used.  Minimal sedation used for procedure.  No paresthesia endorsed by patient during the procedure.  Negative aspiration and negative test dose prior to incremental administration of local anesthetic. The patient tolerated the procedure well with no immediate complications. ? ? ? ?

## 2021-12-07 NOTE — Evaluation (Signed)
Occupational Therapy Evaluation ?Patient Details ?Name: Felicia Henderson ?MRN: [id number] ?DOB: Jan 06, 1946 ?Today's Date: 12/07/2021 ? ? ?History of Present Illness Felicia Henderson is a 76 y.o. female s/p left reverse total shoulder arthroplasty on 12/07/21  ? ?Clinical Impression ?  ?Ms Sciara was seen for OT evaluation this date. Prior to hospital admission, pt was Independent for ADLs and moblity. Pt lives with sister in home c 6 STE and R rail. Pt presents to acute OT demonstrating impaired ADL performance and functional mobility 2/2 impaired functional use of LUE. Pt currently requires MAX A don/doff sling and shirt seated EOC. SUPERVISION for stairs and functional mobility. MOD A don pants, assist to pull up over rear in standing and thread. Daughter present t/o session, all questions answered. All education complete, will sign off. Recommend following surgeons plan.  ?   ? ?Recommendations for follow up therapy are one component of a multi-disciplinary discharge planning process, led by the attending physician.  Recommendations may be updated based on patient status, additional functional criteria and insurance authorization.  ? ?Follow Up Recommendations ? Follow physician's recommendations for discharge plan and follow up therapies  ?  ?Assistance Recommended at Discharge Frequent or constant Supervision/Assistance  ?Patient can return home with the following A little help with walking and/or transfers;A lot of help with bathing/dressing/bathroom;Help with stairs or ramp for entrance ? ?  ?Functional Status Assessment ? Patient has had a recent decline in their functional status and demonstrates the ability to make significant improvements in function in a reasonable and predictable amount of time.  ?Equipment Recommendations ? BSC/3in1  ?  ?Recommendations for Other Services   ? ? ?  ?Precautions / Restrictions Precautions ?Precautions: Shoulder ?Shoulder Interventions: Shoulder  sling/immobilizer;Shoulder abduction pillow;Off for dressing/bathing/exercises ?Precaution Booklet Issued: Yes (comment) ?Required Braces or Orthoses: Sling ?Restrictions ?Weight Bearing Restrictions: Yes ?LUE Weight Bearing: Non weight bearing  ? ?  ? ?Mobility Bed Mobility ?  ?  ?  ?  ?  ?  ?  ?General bed mobility comments: not tested ?  ? ?Transfers ?Overall transfer level: Independent ?  ?  ?  ?  ?  ?  ?  ?  ?  ?  ? ?  ?Balance Overall balance assessment: Mild deficits observed, not formally tested ?  ?  ?  ?  ?  ?  ?  ?  ?  ?  ?  ?  ?  ?  ?  ?  ?  ?  ?   ? ?ADL either performed or assessed with clinical judgement  ? ?ADL Overall ADL's : Needs assistance/impaired ?  ?  ?  ?  ?  ?  ?  ?  ?  ?  ?  ?  ?  ?  ?  ?  ?  ?  ?  ?General ADL Comments: MAX A don/doff sling and shirt seated EOC. SUPERVISION for stairs and functional mobility. MOD A don pants, assist to pull up over rear in standing and thread  ? ? ? ? ?Pertinent Vitals/Pain Pain Assessment ?Pain Assessment: No/denies pain  ? ? ? ?Hand Dominance   ?  ?Extremity/Trunk Assessment Upper Extremity Assessment ?Upper Extremity Assessment: LUE deficits/detail ?LUE: Unable to fully assess due to immobilization ?  ?Lower Extremity Assessment ?Lower Extremity Assessment: Overall WFL for tasks assessed ?  ?  ?  ?Communication Communication ?Communication: No difficulties ?  ?Cognition Arousal/Alertness: Awake/alert ?Behavior During Therapy: [facility] for tasks assessed/performed ?Overall Cognitive Status: Within Functional Limits  for tasks assessed ?  ?  ?  ?  ?  ?  ?  ?  ?  ?  ?  ?  ?  ?  ?  ?  ?  ?  ?  ?   ?   ?   ? ? ?Home Living Family/patient expects to be discharged to:: Private residence ?Living Arrangements: Other relatives;Children ?Available Help at Discharge: Family;Available 24 hours/day ?Type of Home: Mobile home ?Home Access: Stairs to enter ?Entrance Stairs-Number of Steps: 6 ?Entrance Stairs-Rails: Right ?Home Layout: One level ?  ?  ?Bathroom Shower/Tub:  Tub/shower unit ?  ?  ?  ?  ?Home Equipment: None ?  ?  ?  ? ?  ?Prior Functioning/Environment Prior Level of Function : Independent/Modified Independent ?  ?  ?  ?  ?  ?  ?Mobility Comments: no AD use ?  ?  ? ?  ?  ?OT Problem List: Decreased activity tolerance;Impaired balance (sitting and/or standing);Decreased safety awareness;Impaired UE functional use ?  ?   ?   ?OT Goals(Current goals can be found in the care plan section) Acute Rehab OT Goals ?Patient Stated Goal: to go home ?OT Goal Formulation: With patient/family ?Time For Goal Achievement: 12/21/21 ?Potential to Achieve Goals: Good  ?   ? ?Co-evaluation   ?  ?  ?  ?  ? ?  ?AM-PAC OT "6 Clicks" Daily Activity     ?Outcome Measure Help from another person eating meals?: A Little ?Help from another person taking care of personal grooming?: A Little ?Help from another person toileting, which includes using toliet, bedpan, or urinal?: A Little ?Help from another person bathing (including washing, rinsing, drying)?: A Lot ?Help from another person to put on and taking off regular upper body clothing?: A Lot ?Help from another person to put on and taking off regular lower body clothing?: A Lot ?6 Click Score: 15 ?  ?End of Session   ? ?Activity Tolerance: Patient tolerated treatment well ?Patient left: in chair;with call bell/phone within reach;with chair alarm set;with family/visitor present;with nursing/sitter in room ? ?OT Visit Diagnosis: Other abnormalities of gait and mobility (R26.89)  ?              ?Time: 9924-2683 ?OT Time Calculation (min): 43 min ?Charges:  OT General Charges ?$OT Visit: 1 Visit ?OT Evaluation ?$OT Eval Low Complexity: 1 Low ?OT Treatments ?$Self Care/Home Management : 38-52 mins ? ?Kathie Dike, M.S. OTR/L  ?12/07/21, 4:30 PM  ?ascom [phone] ? ?

## 2021-12-07 NOTE — Transfer of Care (Signed)
Immediate Anesthesia Transfer of Care Note ? ?Patient: Felicia Henderson ? ?Procedure(s) Performed: REVERSE SHOULDER ARTHROPLASTY (Left: Shoulder) ? ?Patient Location: PACU ? ?Anesthesia Type:General ? ?Level of Consciousness: awake and alert  ? ?Airway & Oxygen Therapy: Patient Spontanous Breathing and Patient connected to face mask oxygen ? ?Post-op Assessment: Report given to RN and Post -op Vital signs reviewed and stable ? ?Post vital signs: Reviewed and stable ? ?Last Vitals:  ?Vitals Value Taken Time  ?BP 111/58 12/07/21 1234  ?Temp    ?Pulse 72 12/07/21 1234  ?Resp 22 12/07/21 1234  ?SpO2 97 % 12/07/21 1234  ?Vitals shown include unvalidated device data. ? ?Last Pain:  ?Vitals:  ? 12/07/21 0906  ?TempSrc: Oral  ?PainSc: 0-No pain  ?   ? ?  ? ?Complications: No notable events documented. ?

## 2021-12-07 NOTE — Discharge Instructions (Addendum)
Orthopedic discharge instructions: ?May shower with intact OpSite dressing once nerve block has worn off (Monday). ?Apply ice frequently to shoulder or use Polar Care device. ?Take ibuprofen 400-600 mg TID with meals for 5-7 days, then as necessary. ?Take oxycodone as prescribed when needed.  ?May supplement with ES Tylenol if necessary. ?Keep shoulder immobilizer on at all times except may remove for bathing purposes and for exercises. ?Follow-up in 10-14 days or as scheduled. ?

## 2021-12-08 ENCOUNTER — Encounter: Payer: Self-pay | Admitting: Surgery

## 2021-12-08 NOTE — Anesthesia Postprocedure Evaluation (Signed)
Anesthesia Post Note ? ?Patient: Felicia Henderson ? ?Procedure(s) Performed: REVERSE SHOULDER ARTHROPLASTY (Left: Shoulder) ? ?Patient location during evaluation: PACU ?Anesthesia Type: General ?Level of consciousness: awake and alert ?Pain management: pain level controlled ?Vital Signs Assessment: post-procedure vital signs reviewed and stable ?Respiratory status: spontaneous breathing, nonlabored ventilation and respiratory function stable ?Cardiovascular status: blood pressure returned to baseline and stable ?Postop Assessment: no apparent nausea or vomiting ?Anesthetic complications: no ? ? ?No notable events documented. ? ? ?Last Vitals:  ?Vitals:  ? 12/07/21 1450 12/07/21 1459  ?BP: (!) 114/56 133/65  ?Pulse:  81  ?Resp:  14  ?Temp:  (!) 36.1 ?C  ?SpO2:    ?  ?Last Pain:  ?Vitals:  ? 12/07/21 1459  ?TempSrc: Temporal  ?PainSc: 0-No pain  ? ? ?  ?  ?  ?  ?  ?  ? ?Foye Deer ? ? ? ? ?

## 2021-12-11 LAB — SURGICAL PATHOLOGY

## 2023-04-29 IMAGING — MG DIGITAL DIAGNOSTIC BILAT W/ TOMO W/ CAD
6 of 12 series · 6 of 36 positions shown · non-contrast
Comparison: None.

CLINICAL DATA: Oval RIGHT breast mass seen on prior CT in 7477.

EXAM:
DIGITAL DIAGNOSTIC BILATERAL MAMMOGRAM WITH TOMOSYNTHESIS AND CAD;
ULTRASOUND RIGHT BREAST LIMITED
TECHNIQUE: Bilateral digital diagnostic mammography and breast tomosynthesis
was performed. The images were evaluated with computer-aided
detection.; Targeted ultrasound examination of the right breast was
performed

[R CC synth-2D]
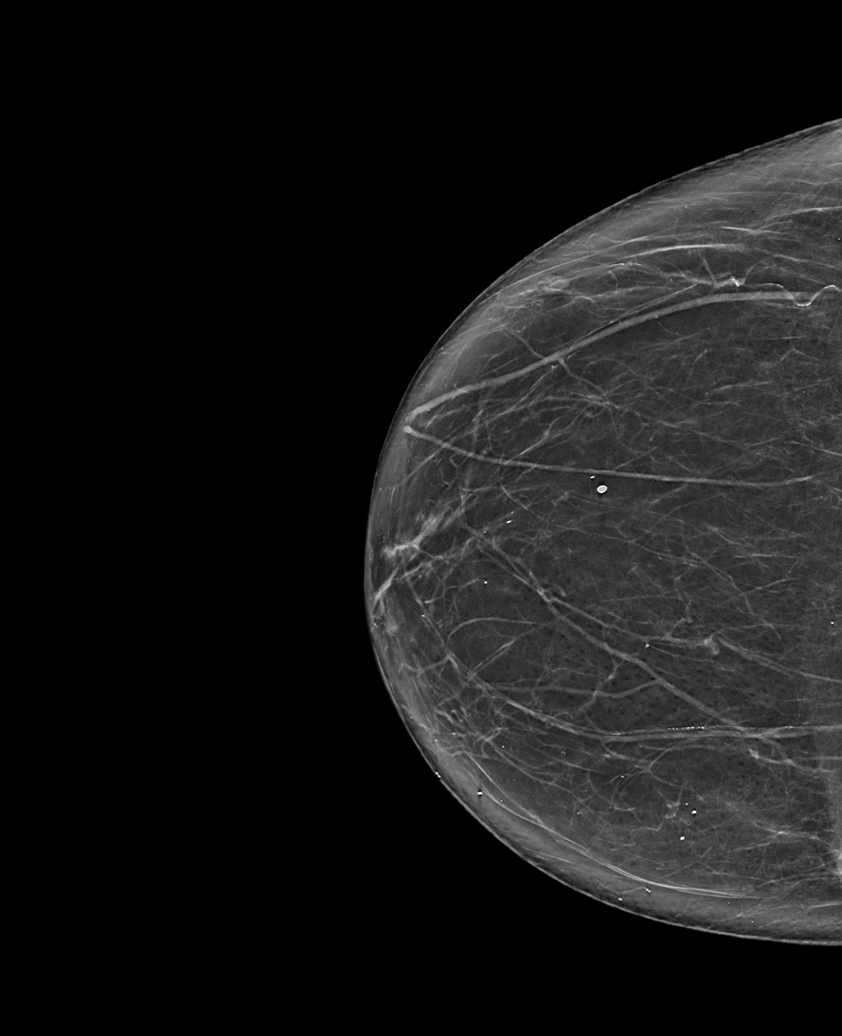

[L CC synth-2D (1 of 2)]
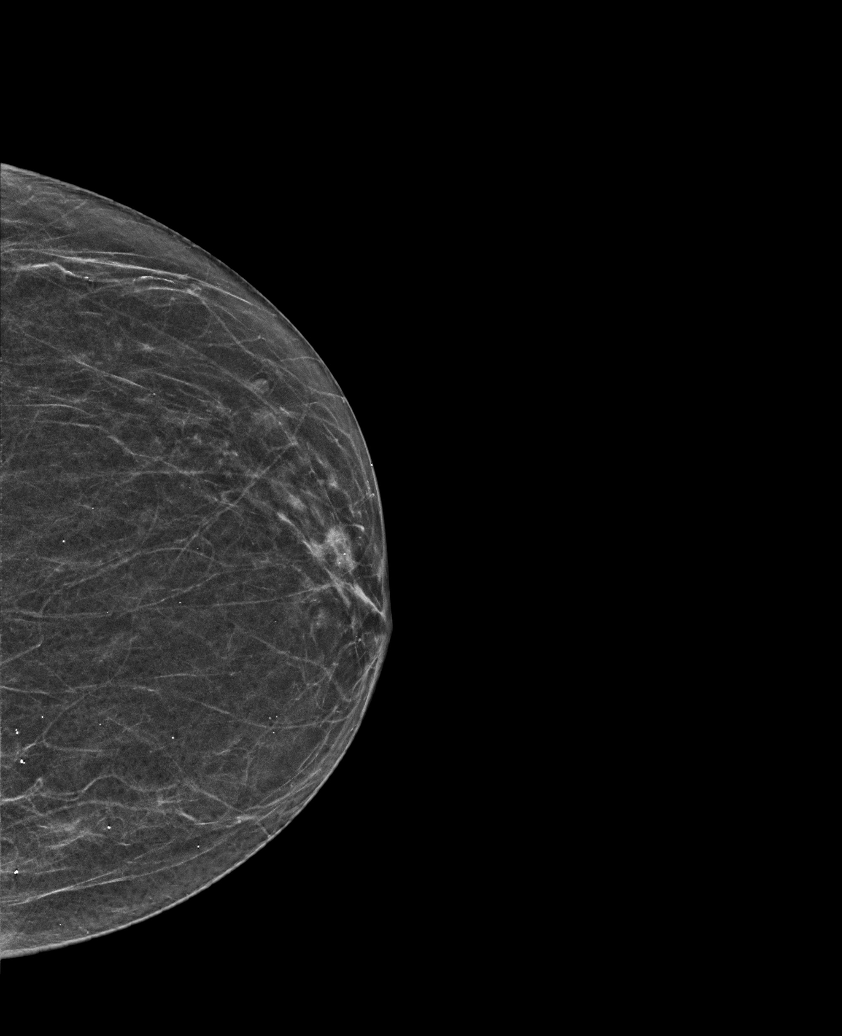

[R XCCL synth-2D]
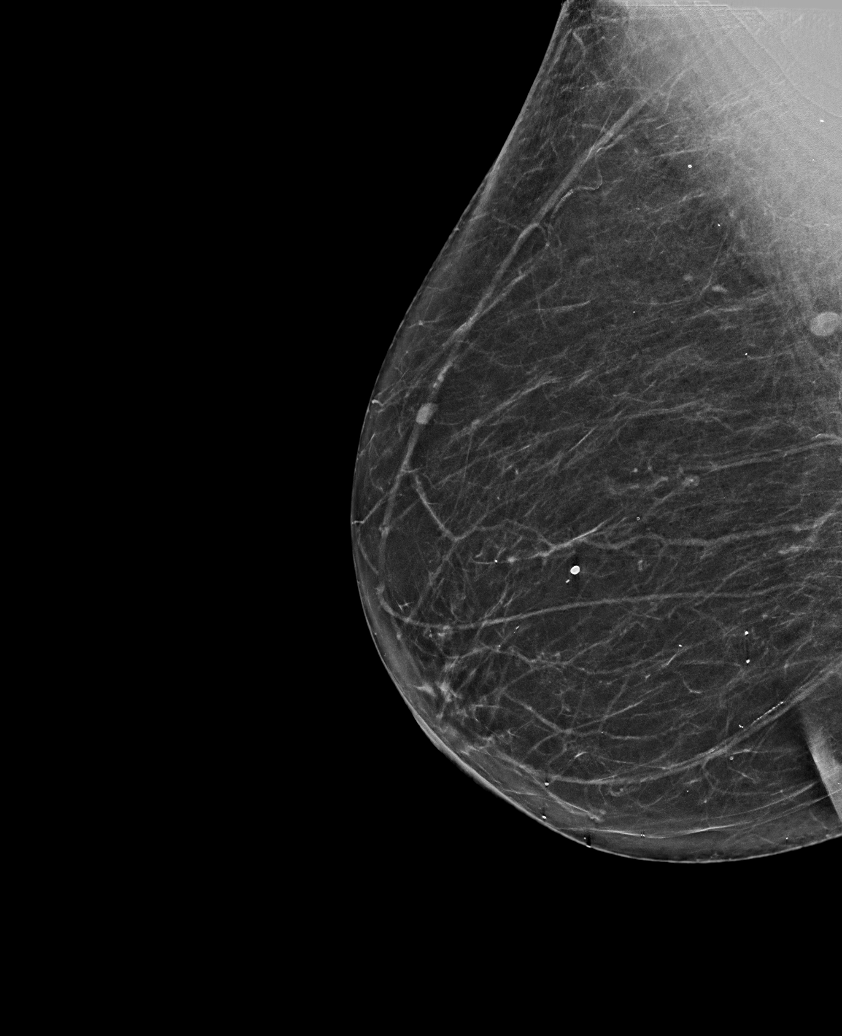

[L MLO synth-2D]
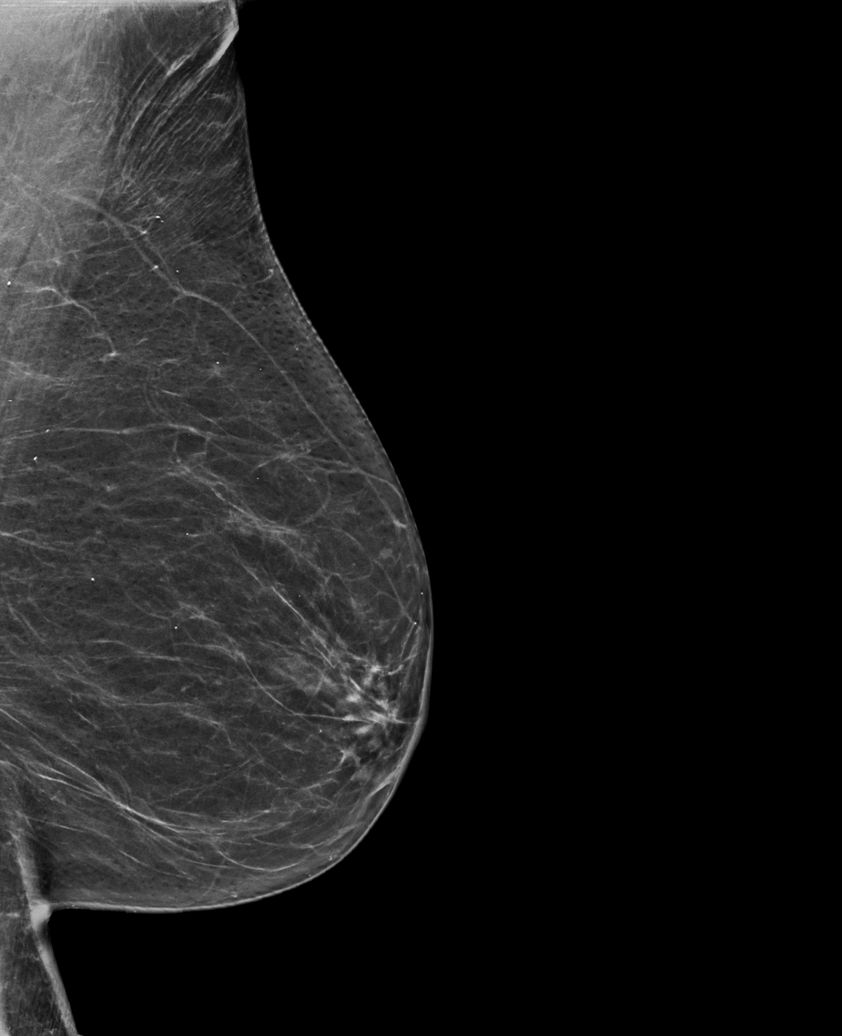

[R MLO synth-2D]
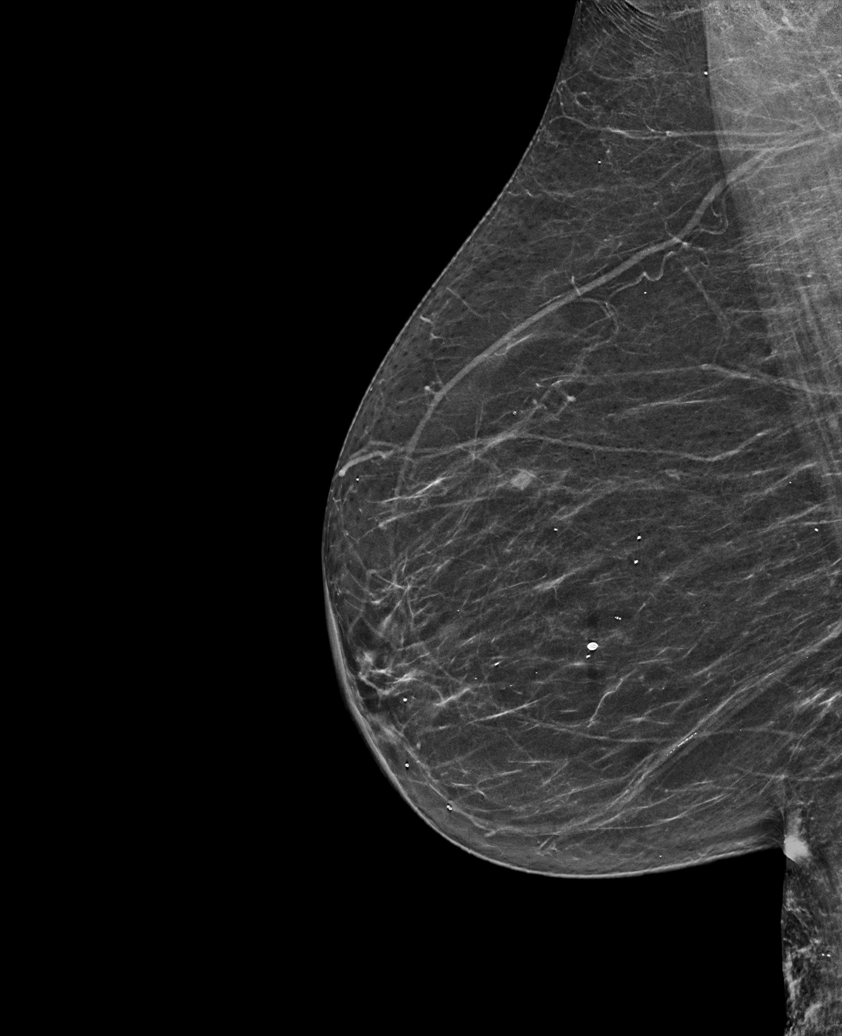

[L CC synth-2D (2 of 2)]
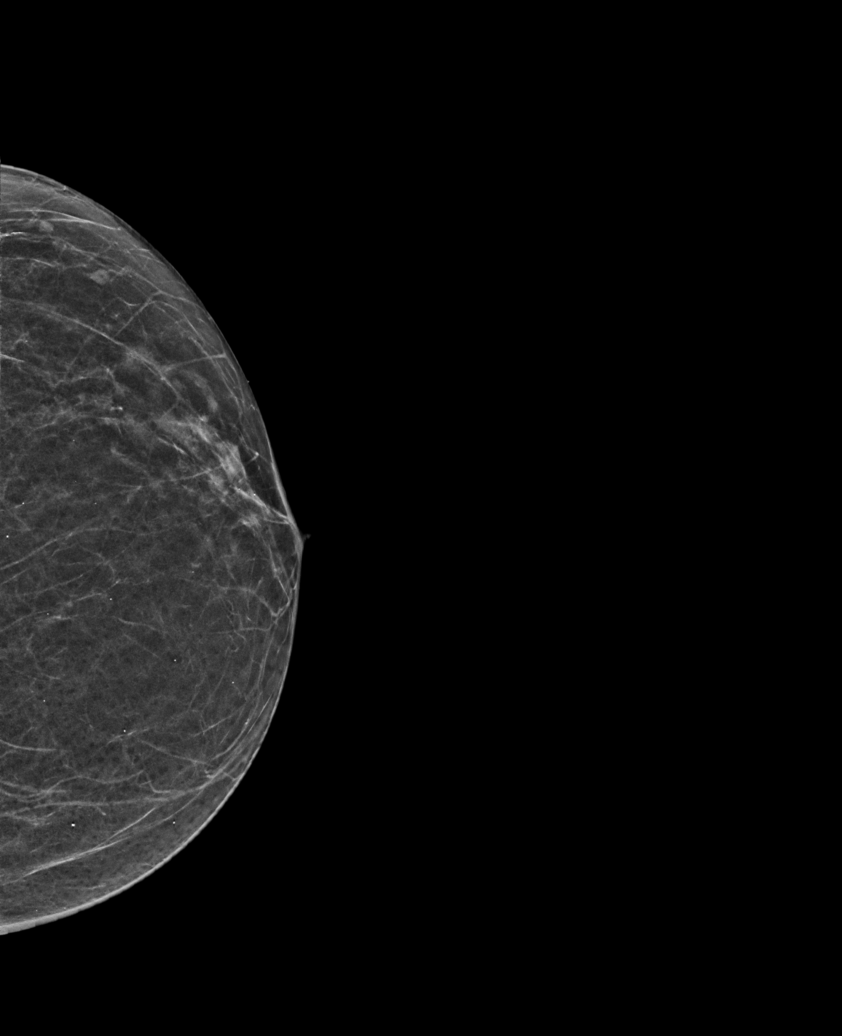

[6 of 36 positions shown; findings below may reference images not displayed]

Prior CT dated November 08, 2020

ACR Breast Density Category a: The breast tissue is almost entirely
fatty.
FINDINGS: On exaggerated lateral imaging the RIGHT outer breast, there is an
oval circumscribed mass with a fatty notch most consistent with a
benign intramammary lymph node. This corresponds to the mass noted
on CT. No suspicious mass, distortion, or microcalcifications are
identified to suggest presence of malignancy bilaterally.

Targeted ultrasound was performed of the RIGHT lower outer breast.
At 9 o'clock 12 cm from the nipple, there is an oval circumscribed
mass with a thin smooth cortex and normal echogenic hilum. It
measures 8 by 4 x 6 mm and is consistent with a benign intramammary
lymph node. This corresponds to the mass noted on CT and
mammographically.
IMPRESSION: 1. There is a benign intramammary lymph node at the site of concern
on the CT dated November 08, 2020.
2. No mammographic evidence of malignancy bilaterally.

RECOMMENDATION:
Screening mammogram in one year.(Code:[id number])

I have discussed the findings and recommendations with the patient.
If applicable, a reminder letter will be sent to the patient
regarding the next appointment.

BI-RADS CATEGORY  2: Benign.

## 2023-06-17 IMAGING — DX DG SHOULDER 1V*L*
2 series · 2 of 2 positions shown · non-contrast
Comparison: None.

CLINICAL DATA: Status post reverse arthroplasty left shoulder

EXAM:
LEFT SHOULDER

[shoulder ap]
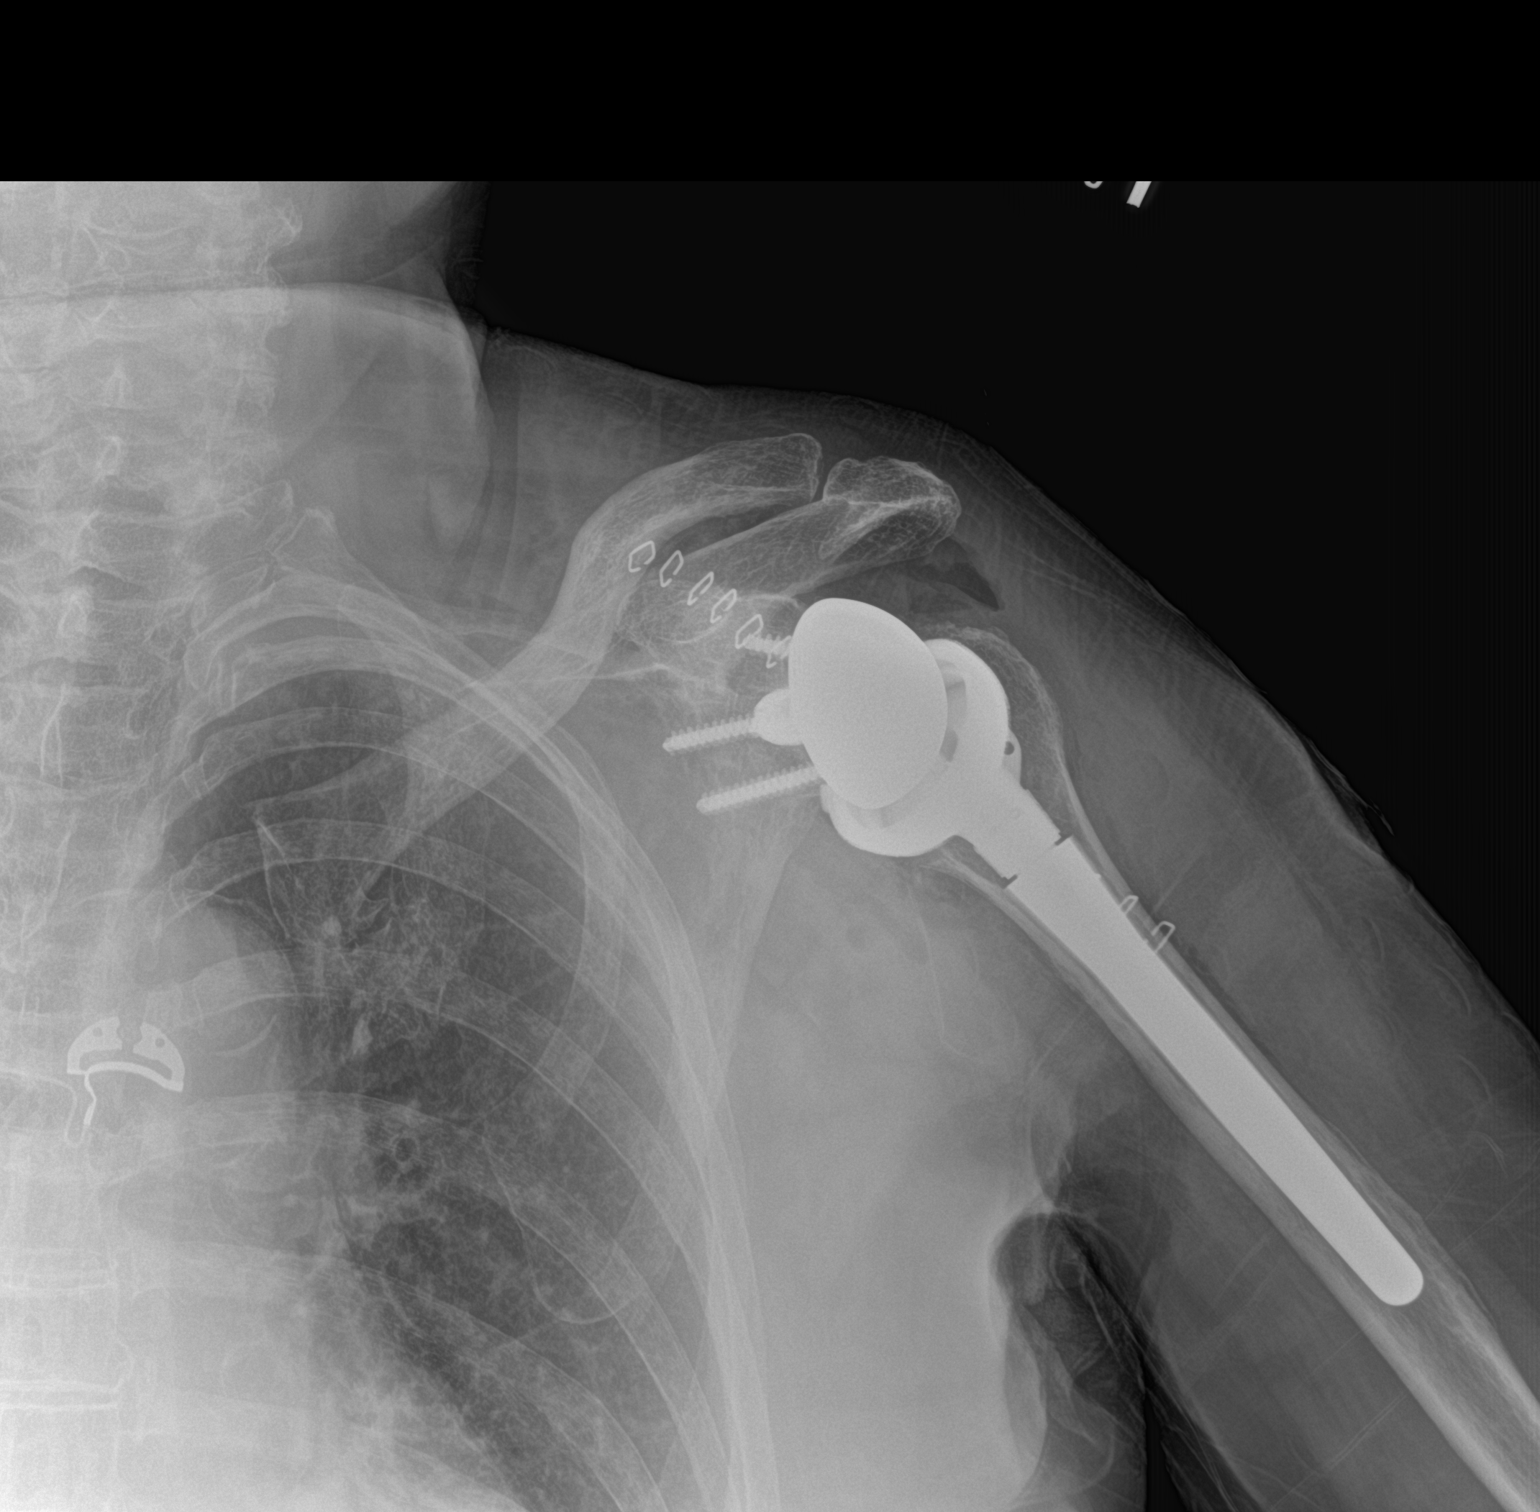

[shoulder obl]
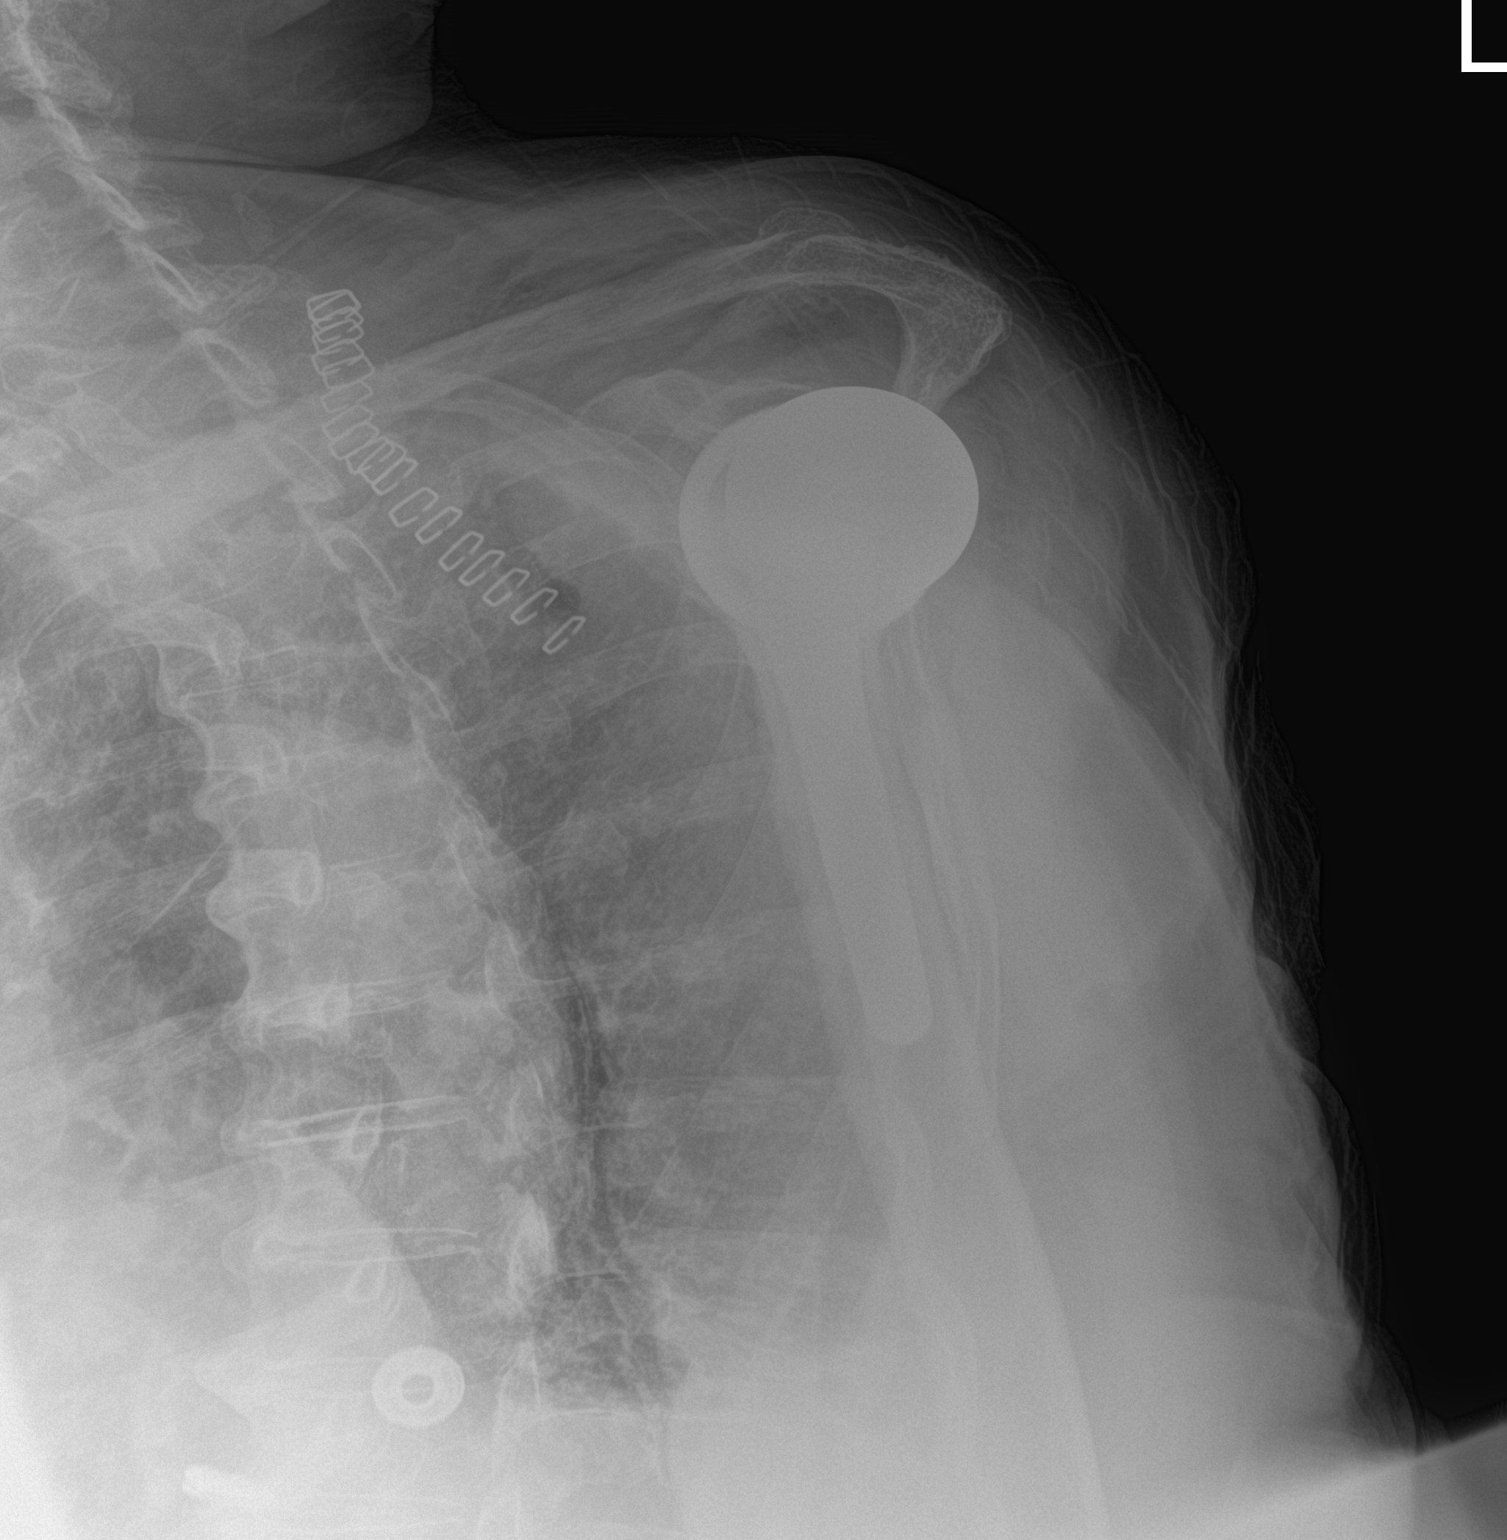

[2 of 2 positions shown; findings below may reference images not displayed]

FINDINGS: There is evidence of recent reverse arthroplasty in the left
shoulder. No fracture is seen. Skin staples are seen. There are
pockets of air in the soft tissues.
IMPRESSION: Status post left shoulder arthroplasty.

## 2024-08-25 ENCOUNTER — Other Ambulatory Visit: Payer: Self-pay | Admitting: Family Medicine

## 2024-08-25 DIAGNOSIS — M5416 Radiculopathy, lumbar region: Secondary | ICD-10-CM

## 2024-08-29 ENCOUNTER — Ambulatory Visit
Admission: RE | Admit: 2024-08-29 | Discharge: 2024-08-29 | Disposition: A | Discharge status: Home / Self Care | Source: Ambulatory Visit | Attending: Family Medicine | Admitting: Family Medicine

## 2024-08-29 DIAGNOSIS — M5416 Radiculopathy, lumbar region: Secondary | ICD-10-CM

## 2024-09-09 ENCOUNTER — Other Ambulatory Visit: Payer: Self-pay | Admitting: Family Medicine

## 2024-09-09 DIAGNOSIS — S32030G Wedge compression fracture of third lumbar vertebra, subsequent encounter for fracture with delayed healing: Secondary | ICD-10-CM

## 2024-09-10 ENCOUNTER — Other Ambulatory Visit (HOSPITAL_COMMUNITY): Payer: Self-pay | Admitting: Interventional Radiology

## 2024-09-10 ENCOUNTER — Inpatient Hospital Stay
Admission: RE | Admit: 2024-09-10 | Discharge: 2024-09-10 | Disposition: A | Source: Ambulatory Visit | Attending: Family Medicine | Admitting: Family Medicine

## 2024-09-10 VITALS — BP 148/60 | HR 78 | Temp 97.8°F | Resp 16 | Wt 175.0 lb

## 2024-09-10 DIAGNOSIS — R7303 Prediabetes: Secondary | ICD-10-CM

## 2024-09-10 DIAGNOSIS — S32030G Wedge compression fracture of third lumbar vertebra, subsequent encounter for fracture with delayed healing: Secondary | ICD-10-CM

## 2024-09-10 DIAGNOSIS — Z9889 Other specified postprocedural states: Secondary | ICD-10-CM

## 2024-09-10 HISTORY — PX: IR RADIOLOGIST EVAL & MGMT: IMG5224

## 2024-09-10 NOTE — Consult Note (Signed)
 Chief Complaint: Patient was seen in consultation today for back pain in the setting od L3 fracture at the request of Meeler,Whitney L  Referring Physician(s): Meeler,Whitney L  History of Present Illness: Felicia Henderson is a 78 y.o. female with a history of osteoporosis who presents with a several month history of back pain that began after a minor fall from standing.  She had an MRI on 08/29/24 that showed   Subacute superior endplate compression fracture at L3 with approximately 25% height loss and marrow edema indicating incomplete healing, without significant retropulsion.  Her pain is severe - she rates it a 10/10 without pain medications and an 8/10 with pain medication.  Her symptoms are debilitating as well, she scored an 18/24 on the [employer] disability questionnaire.   Mild associated intermittent bilateral leg radiculopathy.   Past Medical History:  Diagnosis Date   Aortic atherosclerosis    Arthritis    High cholesterol    Hypertension    Hypothyroidism    Pre-diabetes     Past Surgical History:  Procedure Laterality Date   APPENDECTOMY     COLONOSCOPY     LUMBAR LAMINECTOMY/DECOMPRESSION MICRODISCECTOMY N/A 03/09/2019   Procedure: lumbar  laminectomy L4-5;  Surgeon: Bluford Standing, MD;  Location: [facility] ORS;  Service: Neurosurgery;  Laterality: N/A;   REVERSE SHOULDER ARTHROPLASTY Left 12/07/2021   Procedure: REVERSE SHOULDER ARTHROPLASTY;  Surgeon: Edie Norleen PARAS, MD;  Location: [facility] ORS;  Service: Orthopedics;  Laterality: Left;   TONSILLECTOMY      Allergies: Patient has no known allergies.  Medications: Prior to Admission medications   Medication Sig Start Date End Date Taking? Authorizing Provider  Cholecalciferol (VITAMIN D) 50 MCG (2000 UT) CAPS Take 2,000 Units by mouth daily.   Yes [provider]  Coenzyme Q10 (CO Q-10 PO) Take 1 capsule by mouth daily.   Yes [provider]  gabapentin  (NEURONTIN ) 300 MG capsule Take 300  mg by mouth at bedtime.   Yes [provider]  levothyroxine  (SYNTHROID , LEVOTHROID) 75 MCG tablet Take 75 mcg by mouth daily before breakfast.   Yes [provider]  lisinopril (PRINIVIL,ZESTRIL) 10 MG tablet Take 10 mg by mouth every morning.   Yes [provider]  Multiple Vitamin (MULTIVITAMIN WITH MINERALS) TABS tablet Take 1 tablet by mouth daily.   Yes [provider]  Omega-3 Fatty Acids (FISH OIL PO) Take 2 tablets by mouth daily at 6 (six) AM.   Yes [provider]  traMADol (ULTRAM) 50 MG tablet Take 50 mg by mouth 2 (two) times daily.   Yes [provider]  ibuprofen (ADVIL) 200 MG tablet Take 400 mg by mouth every 6 (six) hours as needed for headache or moderate pain. Patient not taking: Reported on 09/10/2024    [provider]  methocarbamol  (ROBAXIN ) 500 MG tablet Take 1 tablet (500 mg total) by mouth every 6 (six) hours as needed for muscle spasms. Patient not taking: Reported on 11/21/2021 03/10/19   Cheryln Palma, PA-C  ondansetron  (ZOFRAN -ODT) 4 MG disintegrating tablet Take 1 tablet (4 mg total) by mouth every 8 (eight) hours as needed for nausea or vomiting. Patient not taking: Reported on 09/10/2024 12/07/21   Poggi, Norleen PARAS, MD  oxyCODONE  (ROXICODONE ) 5 MG immediate release tablet Take 1 tablet (5 mg total) by mouth every 4 (four) hours as needed for moderate pain or severe pain. Patient not taking: Reported on 09/10/2024 12/07/21   Poggi, John J, MD  pantoprazole (PROTONIX)  40 MG tablet Take 40 mg by mouth daily as needed for heartburn. Patient not taking: Reported on 09/10/2024 10/22/21   [provider]  traZODone (DESYREL) 100 MG tablet Take 50 mg by mouth at bedtime. Patient not taking: Reported on 11/21/2021    [provider]  vitamin E 180 MG (400 UNITS) capsule Take 400 Units by mouth daily. Patient not taking: Reported on 09/10/2024    [provider]     Family History  Problem  Relation Age of Onset   Breast cancer Mother 13    Social History   Socioeconomic History   Marital status: Widowed    Spouse name: Not on file   Number of children: Not on file   Years of education: Not on file   Highest education level: Not on file  Occupational History   Not on file  Tobacco Use   Smoking status: Former    Current packs/day: 0.00    Average packs/day: 0.3 packs/day for 5.0 years (1.3 ttl pk-yrs)    Types: Cigarettes    Start date: 2013    Quit date: 2018    Years since quitting: 7.9   Smokeless tobacco: Never  Vaping Use   Vaping status: Never Used  Substance and Sexual Activity   Alcohol use: Not Currently   Drug use: Never   Sexual activity: Not on file  Other Topics Concern   Not on file  Social History Narrative   Not on file   Social Drivers of Health   Financial Resource Strain: Low Risk  (08/26/2024)   Received from [facility]   Overall Financial Resource Strain (CARDIA)    Difficulty of Paying Living Expenses: Not hard at all  Food Insecurity: No Food Insecurity (08/26/2024)   Received from [facility]   Hunger Vital Sign    Within the past 12 months, you worried that your food would run out before you got the money to buy more.: Never true    Within the past 12 months, the food you bought just didn't last and you didn't have money to get more.: Never true  Transportation Needs: No Transportation Needs (08/26/2024)   Received from [facility] - Transportation    In the past 12 months, has lack of transportation kept you from medical appointments or from getting medications?: No    Lack of Transportation (Non-Medical): No  Physical Activity: Not on file  Stress: Not on file  Social Connections: Not on file    Review of Systems: A 12 point ROS discussed and pertinent positives are indicated in the HPI above.  All other systems are negative.  Review of Systems  Vital  Signs: BP (!) 148/60 (BP Location: Left Arm, Patient Position: Sitting, Cuff Size: Normal)   Pulse 78   Temp 97.8 F (36.6 C) (Oral)   Resp 16   Wt 79.4 kg   SpO2 98%   BMI 32.01 kg/m    Physical Exam Constitutional:      General: She is not in acute distress.    Appearance: Normal appearance.  HENT:     Head: Normocephalic and atraumatic.  Eyes:     General: No scleral icterus. Cardiovascular:     Rate and Rhythm: Normal rate.  Pulmonary:     Effort: Pulmonary effort is normal.  Abdominal:     General: There is no distension.     Tenderness: There is no abdominal tenderness. There is  no guarding.  Musculoskeletal:       Back:     Comments: TTP at the L3 level  Skin:    General: Skin is warm and dry.  Neurological:     Mental Status: She is alert and oriented to person, place, and time.  Psychiatric:        Mood and Affect: Mood normal.        Behavior: Behavior normal.       Imaging: MR LUMBAR SPINE WO CONTRAST Result Date: 08/29/2024 EXAM: MRI LUMBAR SPINE 08/29/2024 04:33:04 PM TECHNIQUE: Multiplanar multisequence MRI of the lumbar spine was performed without the administration of intravenous contrast. COMPARISON: MRI of the lumbar spine 12/06/2018. CLINICAL HISTORY: Low back pain and hip pain for 3 months after a fall. FINDINGS: BONES AND ALIGNMENT: Normal alignment. A subacute superior endplate compression fracture at L3 demonstrates 25% loss of height. No significant retropulsed bone is present. Edema is present throughout the L3 vertebral body compatible with incomplete healing. A prominent hemangioma is again seen at L3. Normal vertebral body heights otherwise. Bone marrow signal is unremarkable otherwise. SPINAL CORD: The conus terminates normally. SOFT TISSUES: No paraspinal mass. L1-L2: No significant disc herniation. No spinal canal stenosis or neural foraminal narrowing. L2-L3: Broad-based disc protrusion and moderate facet hypertrophy at L2-L3 has  progressed since the prior exam. Mild foraminal narrowing is present bilaterally. L3-L4: Broad-based disc protrusion and moderate facet hypertrophy at L3-L4 is progressed since the prior exam. Mild subarticular and foraminal narrowing is present bilaterally. L4-L5: Broad-based disc protrusion is present at L4-L5. Central canal is decompressed. Advanced facet hypertrophy is present. Moderate foraminal narrowing is worse on the right, slightly progressed since the prior study. L5-S1: Chronic disc height loss and a broad-based disc protrusion is present at L5-S1. Mild subarticular and right foraminal narrowing is present. IMPRESSION: 1. Subacute superior endplate compression fracture at L3 with approximately 25% height loss and marrow edema indicating incomplete healing, without significant retropulsion. 2. Advanced facet arthropathy at L4-5 with moderate foraminal narrowing, worse on the right, slightly progressed since the prior study. Electronically signed by: Lonni Necessary MD 08/29/2024 04:50 PM EST RP Workstation: [id number]    Labs:  CBC: No results for input(s): WBC, HGB, HCT, PLT in the last 8760 hours.  COAGS: No results for input(s): INR, APTT in the last 8760 hours.  BMP: No results for input(s): NA, K, CL, CO2, GLUCOSE, BUN, CALCIUM, CREATININE, GFRNONAA, GFRAA in the last 8760 hours.  Invalid input(s): CMP  LIVER FUNCTION TESTS: No results for input(s): BILITOT, AST, ALT, ALKPHOS, PROT, ALBUMIN in the last 8760 hours.  TUMOR MARKERS: No results for input(s): AFPTM, CEA, CA199, CHROMGRNA in the last 8760 hours.  Assessment & Plan:   Patient has suffered subacute osteoporotic fracture of the L3 vertebra.   History and exam have demonstrated the following:  Acute/Subacute fracture by imaging dated 08/29/24, Pain on exam concordant with level of fracture, Failure of conservative therapy and pain refractory to narcotic pain  mediation, and Significant disability on the [employer] Disability Questionnaire with 18/24 positive symptoms, reflecting significant impact/impairment of (ADLs)   ICD-10-CM Codes that Support Medical Necessity ([email or web address]?[email or web address])  M80.08XA    Age-related osteoporosis with current pathological fracture, vertebra(e), initial encounter for fracture   Plan:  DEXA L3 vertebral body augmentation with balloon kyphoplasty  Post-procedure disposition: outpatient DRI-A  Medication holds: None  The patient has suffered a fracture of the L3 vertebral body. It is recommended that patients aged 55 years or older be  evaluated for possible testing or treatment of osteoporosis. A copy of this consult report is sent to the patient's referring physician.     Total time spent on today's visit was over 60 Minutes  including both face-to-face time and non face-to-face time, personally spent on review of chart (including labs and relevant imaging), discussing further workup and treatment options, referral to specialist if needed, reviewing outside records if pertinent, answering patient questions, and coordinating care regarding [email or web address]    Age-related osteoporosis with current pathological fracture, vertebra(e), initial encounter for fracture as well as management strategy.    Electronically Signed: Wilkie POUR Minh Jasper 09/10/2024, 1:28 PM

## 2024-09-11 LAB — COMPLETE METABOLIC PANEL WITHOUT GFR
AG Ratio: 1.8 (calc) (ref 1.0–2.5)
ALT: 10 U/L
AST: 21 U/L (ref 10–35)
Albumin: 4.8 g/dL (ref 3.6–5.1)
Alkaline phosphatase (APISO): 85 U/L
BUN/Creatinine Ratio: 47 (calc) — ABNORMAL HIGH (ref 6–22)
BUN: 26 mg/dL — ABNORMAL HIGH (ref 7–25)
CO2: 24 mmol/L (ref 20–32)
Calcium: 9.7 mg/dL
Chloride: 101 mmol/L (ref 98–110)
Creat: 0.55 mg/dL
Globulin: 2.7 g/dL (ref 1.9–3.7)
Glucose, Bld: 79 mg/dL (ref 65–99)
Potassium: 4.4 mmol/L (ref 3.5–5.3)
Sodium: 136 mmol/L (ref 135–146)
Total Bilirubin: 0.5 mg/dL (ref 0.2–1.2)
Total Protein: 7.5 g/dL (ref 6.1–8.1)

## 2024-09-11 LAB — CP4508-PT/INR AND PTT
INR: 1
Prothrombin Time: 10.3 s (ref 9.0–11.5)
aPTT: 27 s (ref 23–32)

## 2024-09-11 LAB — CBC WITH DIFFERENTIAL/PLATELET
Absolute Lymphocytes: 2250 {cells}/uL (ref 850–3900)
Absolute Monocytes: 631 {cells}/uL (ref 200–950)
Basophils Absolute: 68 {cells}/uL (ref 0–200)
Basophils Relative: 0.9 %
Eosinophils Absolute: 213 {cells}/uL (ref 15–500)
Eosinophils Relative: 2.8 %
HCT: 36.2 % — ABNORMAL LOW
Hemoglobin: 11.9 g/dL — ABNORMAL LOW (ref 13.2–15.5)
MCH: 29.2 pg (ref 27.0–33.0)
MCHC: 32.9 g/dL (ref 31.6–35.4)
MCV: 88.7 fL (ref 81.4–101.7)
MPV: 11 fL (ref 7.5–12.5)
Monocytes Relative: 8.3 %
Neutro Abs: 4438 {cells}/uL (ref 1500–7800)
Neutrophils Relative %: 58.4 %
Platelets: 347 Thousand/uL (ref 140–400)
RBC: 4.08 Million/uL — ABNORMAL LOW (ref 4.20–5.10)
RDW: 13 % (ref 11.0–15.0)
Total Lymphocyte: 29.6 %
WBC: 7.6 Thousand/uL (ref 3.8–10.8)

## 2024-09-11 LAB — HOUSE ACCOUNT TRACKING

## 2024-09-14 ENCOUNTER — Other Ambulatory Visit: Payer: Self-pay | Admitting: Interventional Radiology

## 2024-09-14 DIAGNOSIS — S32030A Wedge compression fracture of third lumbar vertebra, initial encounter for closed fracture: Secondary | ICD-10-CM

## 2024-09-14 DIAGNOSIS — M8008XA Age-related osteoporosis with current pathological fracture, vertebra(e), initial encounter for fracture: Secondary | ICD-10-CM

## 2024-09-21 ENCOUNTER — Telehealth: Payer: Self-pay

## 2024-09-21 NOTE — Discharge Instructions (Signed)

## 2024-09-22 ENCOUNTER — Inpatient Hospital Stay: Admission: RE | Admit: 2024-09-22

## 2024-09-22 DIAGNOSIS — M8008XA Age-related osteoporosis with current pathological fracture, vertebra(e), initial encounter for fracture: Secondary | ICD-10-CM

## 2024-09-22 HISTORY — PX: IR KYPHO LUMBAR INC FX REDUCE BONE BX UNI/BIL CANNULATION INC/IMAGING: IMG5519

## 2024-09-22 MED ORDER — FENTANYL CITRATE (PF) 50 MCG/ML IJ SOSY: 25.0000 ug | PREFILLED_SYRINGE | INTRAMUSCULAR | Status: DC | PRN

## 2024-09-22 MED ORDER — MIDAZOLAM HCL (PF) 2 MG/2ML IJ SOLN
INTRAMUSCULAR | Status: AC | PRN
  Administered 2024-09-22: 13:00:00 .5 mg via INTRAVENOUS
  Administered 2024-09-22 (×2): 1 mg via INTRAVENOUS
  Administered 2024-09-22: 13:00:00 .5 mg via INTRAVENOUS

## 2024-09-22 MED ORDER — LIDOCAINE-EPINEPHRINE 1 %-1:100000 IJ SOLN
10.0000 mL | Freq: Once | INTRAMUSCULAR | Status: AC
  Administered 2024-09-22: 13:00:00 10 mL via INTRADERMAL

## 2024-09-22 MED ORDER — SODIUM CHLORIDE 0.9 % IV SOLN: INTRAVENOUS | Status: DC

## 2024-09-22 MED ORDER — MIDAZOLAM HCL (PF) 2 MG/2ML IJ SOLN: 1.0000 mg | INTRAMUSCULAR | Status: DC | PRN

## 2024-09-22 MED ORDER — ACETAMINOPHEN 10 MG/ML IV SOLN
1000.0000 mg | Freq: Once | INTRAVENOUS | Status: AC
  Administered 2024-09-22: 13:00:00 1000 mg via INTRAVENOUS

## 2024-09-22 MED ORDER — CEFAZOLIN SODIUM-DEXTROSE 2-4 GM/100ML-% IV SOLN
2.0000 g | INTRAVENOUS | Status: AC
  Administered 2024-09-22: 12:00:00 2 g via INTRAVENOUS

## 2024-09-22 MED ORDER — FENTANYL CITRATE (PF) 100 MCG/2ML IJ SOLN
INTRAMUSCULAR | Status: AC | PRN
  Administered 2024-09-22 (×2): 50 ug via INTRAVENOUS

## 2024-09-22 NOTE — Sedation Documentation (Signed)
 Cement inserted.

## 2024-09-22 NOTE — H&P (Signed)
 Chief Complaint: Patient was seen today for L3 fracture and kyphoplasty  Referring Physician(s): Benton Dowse, NP  Consulting Physician: Karalee Beat  History of Present Illness: Felicia Henderson is a 78 y.o. female with a history of osteoporosis and subacute superior endplate compression fracture of L3 who was previously evaluated in clinic on 12/4 by Dr. VEAR Karalee to discuss possible kyphoplasty. At the time she reported severe 10/10 pain with minimal improvement with pain medications. She scored an 18/24 on the [employer] disability questionnaire and reported mild intermittent radiculopathy in bilateral lower extremities. Following discussion, patient was agreeable to pursue L3 vertebral body augmentation with balloon kyphoplasty. She presents today for her procedure.   Patient continues to report extreme lower back pain with associated radiculopathy and admits to minimal pain relief with medications. She denies any recent fevers/chills, abdominal pain, chest pain, or shortness of breath. NPO since midnight. All questions and concerns answered at the bedside.   Past Medical History:  Diagnosis Date   Aortic atherosclerosis    Arthritis    High cholesterol    Hypertension    Hypothyroidism    Pre-diabetes     Past Surgical History:  Procedure Laterality Date   APPENDECTOMY     COLONOSCOPY     IR RADIOLOGIST EVAL & MGMT  09/10/2024   LUMBAR LAMINECTOMY/DECOMPRESSION MICRODISCECTOMY N/A 03/09/2019   Procedure: lumbar  laminectomy L4-5;  Surgeon: Bluford Standing, MD;  Location: [facility] ORS;  Service: Neurosurgery;  Laterality: N/A;   REVERSE SHOULDER ARTHROPLASTY Left 12/07/2021   Procedure: REVERSE SHOULDER ARTHROPLASTY;  Surgeon: Edie Norleen PARAS, MD;  Location: [facility] ORS;  Service: Orthopedics;  Laterality: Left;   TONSILLECTOMY      Allergies: Patient has no known allergies.  Medications: Prior to Admission medications  Medication Sig Start Date End Date Taking?  Authorizing Provider  Cholecalciferol (VITAMIN D) 50 MCG (2000 UT) CAPS Take 2,000 Units by mouth daily.    [provider]  Coenzyme Q10 (CO Q-10 PO) Take 1 capsule by mouth daily.    [provider]  gabapentin  (NEURONTIN ) 300 MG capsule Take 300 mg by mouth at bedtime.    [provider]  ibuprofen (ADVIL) 200 MG tablet Take 400 mg by mouth every 6 (six) hours as needed for headache or moderate pain. Patient not taking: Reported on 09/10/2024    [provider]  levothyroxine  (SYNTHROID , LEVOTHROID) 75 MCG tablet Take 75 mcg by mouth daily before breakfast.    [provider]  lisinopril (PRINIVIL,ZESTRIL) 10 MG tablet Take 10 mg by mouth every morning.    [provider]  methocarbamol  (ROBAXIN ) 500 MG tablet Take 1 tablet (500 mg total) by mouth every 6 (six) hours as needed for muscle spasms. Patient not taking: Reported on 11/21/2021 03/10/19   Cheryln Palma, PA-C  Multiple Vitamin (MULTIVITAMIN WITH MINERALS) TABS tablet Take 1 tablet by mouth daily.    [provider]  Omega-3 Fatty Acids (FISH OIL PO) Take 2 tablets by mouth daily at 6 (six) AM.    [provider]  ondansetron  (ZOFRAN -ODT) 4 MG disintegrating tablet Take 1 tablet (4 mg total) by mouth every 8 (eight) hours as needed for nausea or vomiting. Patient not taking: Reported on 09/10/2024 12/07/21   Poggi, Norleen PARAS, MD  oxyCODONE  (ROXICODONE ) 5 MG immediate release tablet Take 1 tablet (5 mg total) by mouth every 4 (four) hours as needed for moderate pain or severe pain. Patient not taking: Reported on 09/10/2024 12/07/21  Poggi, John J, MD  pantoprazole (PROTONIX) 40 MG tablet Take 40 mg by mouth daily as needed for heartburn. Patient not taking: Reported on 09/10/2024 10/22/21   [provider]  traMADol (ULTRAM) 50 MG tablet Take 50 mg by mouth 2 (two) times daily.    [provider]  traZODone (DESYREL) 100 MG tablet Take 50 mg by mouth at  bedtime. Patient not taking: Reported on 11/21/2021    [provider]  vitamin E 180 MG (400 UNITS) capsule Take 400 Units by mouth daily. Patient not taking: Reported on 09/10/2024    [provider]     Family History  Problem Relation Age of Onset   Breast cancer Mother 38    Social History   Socioeconomic History   Marital status: Widowed    Spouse name: Not on file   Number of children: Not on file   Years of education: Not on file   Highest education level: Not on file  Occupational History   Not on file  Tobacco Use   Smoking status: Former    Current packs/day: 0.00    Average packs/day: 0.3 packs/day for 5.0 years (1.3 ttl pk-yrs)    Types: Cigarettes    Start date: 2013    Quit date: 2018    Years since quitting: 7.9   Smokeless tobacco: Never  Vaping Use   Vaping status: Never Used  Substance and Sexual Activity   Alcohol use: Not Currently   Drug use: Never   Sexual activity: Not on file  Other Topics Concern   Not on file  Social History Narrative   Not on file   Social Drivers of Health   Tobacco Use: Medium Risk (09/10/2024)   Patient History    Smoking Tobacco Use: Former    Smokeless Tobacco Use: Never    Passive Exposure: Not on Actuary Strain: Low Risk  (08/26/2024)   Received from [facility]   Overall Financial Resource Strain (CARDIA)    Difficulty of Paying Living Expenses: Not hard at all  Food Insecurity: No Food Insecurity (08/26/2024)   Received from [facility]   Epic    Within the past 12 months, you worried that your food would run out before you got the money to buy more.: Never true    Within the past 12 months, the food you bought just didn't last and you didn't have money to get more.: Never true  Transportation Needs: No Transportation Needs (08/26/2024)   Received from [facility] - Transportation    In the past 12 months, has  lack of transportation kept you from medical appointments or from getting medications?: No    Lack of Transportation (Non-Medical): No  Physical Activity: Not on file  Stress: Not on file  Social Connections: Not on file  Depression (EYV7-0): Not on file  Alcohol Screen: Not on file  Housing: Low Risk  (08/26/2024)   Received from [facility]   Epic    In the last 12 months, was there a time when you were not able to pay the mortgage or rent on time?: No    In the past 12 months, how many times have you moved where you were living?: 0    At any time in the past 12 months, were you homeless or living in a shelter (including now)?: No  Utilities: Not At Risk (08/26/2024)   Received from  [facility]   Epic    In the past 12 months has the electric, gas, oil, or water company threatened to shut off services in your home?: No  Health Literacy: Not on file    Review of Systems  Musculoskeletal:  Positive for back pain (severe).    Vital Signs: BP (!) 149/69   Pulse 72   Temp 97.7 F (36.5 C)   Resp 20   Wt 174 lb (78.9 kg)   SpO2 99%   BMI 31.83 kg/m    Physical Exam Constitutional:      Appearance: Normal appearance.  Cardiovascular:     Rate and Rhythm: Normal rate.  Pulmonary:     Effort: Pulmonary effort is normal.  Abdominal:     General: Abdomen is flat.     Tenderness: There is no abdominal tenderness.  Musculoskeletal:     Comments: Decreased ROM with point tenderness to her lower back  Skin:    General: Skin is warm and dry.  Neurological:     Mental Status: She is alert and oriented to person, place, and time.  Psychiatric:        Behavior: Behavior normal.      Imaging: IR Radiologist Eval & Mgmt Result Date: 09/10/2024 EXAM: NEW PATIENT OFFICE VISIT CHIEF COMPLAINT: SEE NOTE IN EPIC HISTORY OF PRESENT ILLNESS: SEE NOTE IN EPIC REVIEW OF SYSTEMS: SEE NOTE IN EPIC PHYSICAL EXAMINATION: SEE NOTE IN EPIC ASSESSMENT AND  PLAN: SEE NOTE IN EPIC Electronically Signed   By: Wilkie Lent M.D.   On: 09/10/2024 13:26   MR LUMBAR SPINE WO CONTRAST Result Date: 08/29/2024 EXAM: MRI LUMBAR SPINE 08/29/2024 04:33:04 PM TECHNIQUE: Multiplanar multisequence MRI of the lumbar spine was performed without the administration of intravenous contrast. COMPARISON: MRI of the lumbar spine 12/06/2018. CLINICAL HISTORY: Low back pain and hip pain for 3 months after a fall. FINDINGS: BONES AND ALIGNMENT: Normal alignment. A subacute superior endplate compression fracture at L3 demonstrates 25% loss of height. No significant retropulsed bone is present. Edema is present throughout the L3 vertebral body compatible with incomplete healing. A prominent hemangioma is again seen at L3. Normal vertebral body heights otherwise. Bone marrow signal is unremarkable otherwise. SPINAL CORD: The conus terminates normally. SOFT TISSUES: No paraspinal mass. L1-L2: No significant disc herniation. No spinal canal stenosis or neural foraminal narrowing. L2-L3: Broad-based disc protrusion and moderate facet hypertrophy at L2-L3 has progressed since the prior exam. Mild foraminal narrowing is present bilaterally. L3-L4: Broad-based disc protrusion and moderate facet hypertrophy at L3-L4 is progressed since the prior exam. Mild subarticular and foraminal narrowing is present bilaterally. L4-L5: Broad-based disc protrusion is present at L4-L5. Central canal is decompressed. Advanced facet hypertrophy is present. Moderate foraminal narrowing is worse on the right, slightly progressed since the prior study. L5-S1: Chronic disc height loss and a broad-based disc protrusion is present at L5-S1. Mild subarticular and right foraminal narrowing is present. IMPRESSION: 1. Subacute superior endplate compression fracture at L3 with approximately 25% height loss and marrow edema indicating incomplete healing, without significant retropulsion. 2. Advanced facet arthropathy at L4-5  with moderate foraminal narrowing, worse on the right, slightly progressed since the prior study. Electronically signed by: Lonni Necessary MD 08/29/2024 04:50 PM EST RP Workstation: [id number]    Labs:  CBC: Recent Labs    09/10/24 1306  WBC 7.6  HGB 11.9*  HCT 36.2*  PLT 347    COAGS: Recent Labs    09/10/24 1306  INR  1.0  APTT 27    BMP: Recent Labs    09/10/24 1306  NA 136  K 4.4  CL 101  CO2 24  GLUCOSE 79  BUN 26*  CALCIUM 9.7  CREATININE 0.55    LIVER FUNCTION TESTS: Recent Labs    09/10/24 1306  BILITOT 0.5  AST 21  ALT 10  PROT 7.5    TUMOR MARKERS: No results for input(s): AFPTM, CEA, CA199, CHROMGRNA in the last 8760 hours.  Assessment and Plan:  Subacture Superioro Endplate Compression Fracture of L49: 78 year old female with a history of osteoporosis and subacute compression fracture of L3 who presents to [facility] for kyphoplasty with Dr. VEAR Lent. Procedure to be performed with moderate sedation.    Risks and benefits of L3 kyphoplasty/vertebroplasty were discussed with the patient including, but not limited to education regarding the natural healing process of compression fractures without intervention, bleeding, infection, cement migration which may cause spinal cord damage, paralysis, pulmonary embolism or even death.  This interventional procedure involves the use of X-rays and because of the nature of the planned procedure, it is possible that we will have prolonged use of X-ray fluoroscopy.  Potential radiation risks to you include (but are not limited to) the following: - A slightly elevated risk for cancer  several years later in life. This risk is typically less than 0.5% percent. This risk is low in comparison to the normal incidence of human cancer, which is 33% for women and 50% for men according to the [employer]. - Radiation induced injury can include skin redness, resembling a rash, tissue  breakdown / ulcers and hair loss (which can be temporary or permanent).   The likelihood of either of these occurring depends on the difficulty of the procedure and whether you are sensitive to radiation due to previous procedures, disease, or genetic conditions.   IF your procedure requires a prolonged use of radiation, you will be notified and given written instructions for further action.  It is your responsibility to monitor the irradiated area for the 2 weeks following the procedure and to notify your physician if you are concerned that you have suffered a radiation induced injury.    All of the patient's questions were answered, patient is agreeable to proceed.  Consent signed and in chart.   Thank you for this interesting consult.  I greatly enjoyed meeting Felicia Henderson and look forward to participating in their care.  A copy of this report was sent to the requesting provider on this date.  Electronically Signed: Glennon HERO Bal 09/22/2024, 12:48 PM   I spent a total of  15 Minutes in face to face in clinical consultation, greater than 50% of which was counseling/coordinating care for L3 fracture and kyphoplasty.

## 2024-09-23 ENCOUNTER — Telehealth: Payer: Self-pay

## 2024-09-29 ENCOUNTER — Telehealth: Payer: Self-pay

## 2024-10-09 ENCOUNTER — Other Ambulatory Visit: Payer: Self-pay | Admitting: Interventional Radiology

## 2024-10-09 DIAGNOSIS — S32030S Wedge compression fracture of third lumbar vertebra, sequela: Secondary | ICD-10-CM

## 2024-10-15 ENCOUNTER — Inpatient Hospital Stay: Admission: RE | Admit: 2024-10-15 | Source: Ambulatory Visit

## 2024-10-22 ENCOUNTER — Other Ambulatory Visit

## 2024-10-30 ENCOUNTER — Telehealth: Payer: Self-pay

## 2024-11-03 ENCOUNTER — Other Ambulatory Visit

## 2024-11-03 ENCOUNTER — Inpatient Hospital Stay: Admission: RE | Admit: 2024-11-03 | Source: Ambulatory Visit

## 2024-11-12 ENCOUNTER — Other Ambulatory Visit

## 2024-11-17 ENCOUNTER — Other Ambulatory Visit
